# Patient Record
Sex: Female | Born: 1960 | Race: Black or African American | Hispanic: No | Marital: Single | Smoking: Current every day smoker
Health system: Southern US, Community
[De-identification: ages and names within clinical notes are randomized; demographics above are authoritative.]

## PROBLEM LIST (undated history)

## (undated) DIAGNOSIS — R0600 Dyspnea, unspecified: Secondary | ICD-10-CM

## (undated) DIAGNOSIS — M1990 Unspecified osteoarthritis, unspecified site: Secondary | ICD-10-CM

## (undated) DIAGNOSIS — J45909 Unspecified asthma, uncomplicated: Secondary | ICD-10-CM

## (undated) DIAGNOSIS — F419 Anxiety disorder, unspecified: Secondary | ICD-10-CM

## (undated) DIAGNOSIS — E7800 Pure hypercholesterolemia, unspecified: Secondary | ICD-10-CM

## (undated) DIAGNOSIS — F32A Depression, unspecified: Secondary | ICD-10-CM

## (undated) DIAGNOSIS — J449 Chronic obstructive pulmonary disease, unspecified: Secondary | ICD-10-CM

## (undated) DIAGNOSIS — K219 Gastro-esophageal reflux disease without esophagitis: Secondary | ICD-10-CM

## (undated) DIAGNOSIS — I10 Essential (primary) hypertension: Secondary | ICD-10-CM

## (undated) DIAGNOSIS — I639 Cerebral infarction, unspecified: Secondary | ICD-10-CM

## (undated) DIAGNOSIS — F329 Major depressive disorder, single episode, unspecified: Secondary | ICD-10-CM

## (undated) HISTORY — PX: CHOLECYSTECTOMY: SHX55

---

## 2010-10-19 DIAGNOSIS — I639 Cerebral infarction, unspecified: Secondary | ICD-10-CM

## 2010-10-19 HISTORY — DX: Cerebral infarction, unspecified: I63.9

## 2016-07-25 ENCOUNTER — Encounter (HOSPITAL_COMMUNITY): Payer: Self-pay

## 2016-07-25 ENCOUNTER — Inpatient Hospital Stay (HOSPITAL_COMMUNITY)
Admission: EM | Admit: 2016-07-25 | Discharge: 2016-07-28 | DRG: 439 | Disposition: A | Discharge status: Home / Self Care | Payer: Medicaid Other | Attending: Internal Medicine | Admitting: Internal Medicine

## 2016-07-25 DIAGNOSIS — R7989 Other specified abnormal findings of blood chemistry: Secondary | ICD-10-CM

## 2016-07-25 DIAGNOSIS — K852 Alcohol induced acute pancreatitis without necrosis or infection: Principal | ICD-10-CM | POA: Diagnosis present

## 2016-07-25 DIAGNOSIS — Z9049 Acquired absence of other specified parts of digestive tract: Secondary | ICD-10-CM

## 2016-07-25 DIAGNOSIS — N390 Urinary tract infection, site not specified: Secondary | ICD-10-CM | POA: Diagnosis present

## 2016-07-25 DIAGNOSIS — K8070 Calculus of gallbladder and bile duct without cholecystitis without obstruction: Secondary | ICD-10-CM | POA: Diagnosis present

## 2016-07-25 DIAGNOSIS — I10 Essential (primary) hypertension: Secondary | ICD-10-CM | POA: Diagnosis present

## 2016-07-25 DIAGNOSIS — F17210 Nicotine dependence, cigarettes, uncomplicated: Secondary | ICD-10-CM | POA: Diagnosis present

## 2016-07-25 DIAGNOSIS — K859 Acute pancreatitis without necrosis or infection, unspecified: Secondary | ICD-10-CM | POA: Diagnosis present

## 2016-07-25 DIAGNOSIS — Z23 Encounter for immunization: Secondary | ICD-10-CM

## 2016-07-25 DIAGNOSIS — R945 Abnormal results of liver function studies: Secondary | ICD-10-CM

## 2016-07-25 DIAGNOSIS — E860 Dehydration: Secondary | ICD-10-CM | POA: Diagnosis present

## 2016-07-25 DIAGNOSIS — R748 Abnormal levels of other serum enzymes: Secondary | ICD-10-CM

## 2016-07-25 DIAGNOSIS — J449 Chronic obstructive pulmonary disease, unspecified: Secondary | ICD-10-CM | POA: Diagnosis present

## 2016-07-25 DIAGNOSIS — E7800 Pure hypercholesterolemia, unspecified: Secondary | ICD-10-CM | POA: Diagnosis present

## 2016-07-25 DIAGNOSIS — F329 Major depressive disorder, single episode, unspecified: Secondary | ICD-10-CM | POA: Diagnosis present

## 2016-07-25 DIAGNOSIS — E871 Hypo-osmolality and hyponatremia: Secondary | ICD-10-CM

## 2016-07-25 DIAGNOSIS — K8050 Calculus of bile duct without cholangitis or cholecystitis without obstruction: Secondary | ICD-10-CM

## 2016-07-25 DIAGNOSIS — K219 Gastro-esophageal reflux disease without esophagitis: Secondary | ICD-10-CM | POA: Diagnosis present

## 2016-07-25 DIAGNOSIS — M1990 Unspecified osteoarthritis, unspecified site: Secondary | ICD-10-CM | POA: Diagnosis present

## 2016-07-25 DIAGNOSIS — Z79899 Other long term (current) drug therapy: Secondary | ICD-10-CM

## 2016-07-25 DIAGNOSIS — F1010 Alcohol abuse, uncomplicated: Secondary | ICD-10-CM | POA: Diagnosis present

## 2016-07-25 DIAGNOSIS — Z8673 Personal history of transient ischemic attack (TIA), and cerebral infarction without residual deficits: Secondary | ICD-10-CM

## 2016-07-25 DIAGNOSIS — F419 Anxiety disorder, unspecified: Secondary | ICD-10-CM | POA: Diagnosis present

## 2016-07-25 HISTORY — DX: Pure hypercholesterolemia, unspecified: E78.00

## 2016-07-25 HISTORY — DX: Gastro-esophageal reflux disease without esophagitis: K21.9

## 2016-07-25 HISTORY — DX: Depression, unspecified: F32.A

## 2016-07-25 HISTORY — DX: Cerebral infarction, unspecified: I63.9

## 2016-07-25 HISTORY — DX: Dyspnea, unspecified: R06.00

## 2016-07-25 HISTORY — DX: Major depressive disorder, single episode, unspecified: F32.9

## 2016-07-25 HISTORY — DX: Unspecified asthma, uncomplicated: J45.909

## 2016-07-25 HISTORY — DX: Essential (primary) hypertension: I10

## 2016-07-25 HISTORY — DX: Anxiety disorder, unspecified: F41.9

## 2016-07-25 HISTORY — DX: Unspecified osteoarthritis, unspecified site: M19.90

## 2016-07-25 HISTORY — DX: Chronic obstructive pulmonary disease, unspecified: J44.9

## 2016-07-25 LAB — COMPREHENSIVE METABOLIC PANEL
ALBUMIN: 3.7 g/dL (ref 3.5–5.0)
ALK PHOS: 69 U/L (ref 38–126)
ALT: 27 U/L (ref 14–54)
AST: 35 U/L (ref 15–41)
Anion gap: 9 (ref 5–15)
BUN: 5 mg/dL — ABNORMAL LOW (ref 6–20)
CALCIUM: 9 mg/dL (ref 8.9–10.3)
CHLORIDE: 94 mmol/L — AB (ref 101–111)
CO2: 20 mmol/L — AB (ref 22–32)
CREATININE: 0.62 mg/dL (ref 0.44–1.00)
GFR calc non Af Amer: >60 mL/min (ref >60)
GLUCOSE: 97 mg/dL (ref 65–99)
Potassium: 3.9 mmol/L (ref 3.5–5.1)
SODIUM: 123 mmol/L — AB (ref 135–145)
Total Bilirubin: 0.6 mg/dL (ref 0.3–1.2)
Total Protein: 7.3 g/dL (ref 6.5–8.1)

## 2016-07-25 LAB — CBC
HCT: 31.6 % — ABNORMAL LOW (ref 36.0–46.0)
Hemoglobin: 11.2 g/dL — ABNORMAL LOW (ref 12.0–15.0)
MCH: 31.3 pg (ref 26.0–34.0)
MCHC: 35.4 g/dL (ref 30.0–36.0)
MCV: 88.3 fL (ref 78.0–100.0)
PLATELETS: 138 10*3/uL — AB (ref 150–400)
RBC: 3.58 MIL/uL — AB (ref 3.87–5.11)
RDW: 15.4 % (ref 11.5–15.5)
WBC: 4.9 10*3/uL (ref 4.0–10.5)

## 2016-07-25 LAB — URINE MICROSCOPIC-ADD ON

## 2016-07-25 LAB — URINALYSIS, ROUTINE W REFLEX MICROSCOPIC
GLUCOSE, UA: NEGATIVE mg/dL
HGB URINE DIPSTICK: NEGATIVE
Ketones, ur: NEGATIVE mg/dL
Nitrite: POSITIVE — AB
PROTEIN: NEGATIVE mg/dL
SPECIFIC GRAVITY, URINE: 1.027 (ref 1.005–1.030)
pH: 5.5 (ref 5.0–8.0)

## 2016-07-25 LAB — LIPASE, BLOOD: LIPASE: 336 U/L — AB (ref 11–51)

## 2016-07-25 MED ORDER — INFLUENZA VAC SPLIT QUAD 0.5 ML IM SUSY
0.5000 mL | PREFILLED_SYRINGE | INTRAMUSCULAR | Status: AC
  Administered 2016-07-28: 0.5 mL via INTRAMUSCULAR
  Filled 2016-07-25 (×2): qty 0.5

## 2016-07-25 MED ORDER — PNEUMOCOCCAL VAC POLYVALENT 25 MCG/0.5ML IJ INJ
0.5000 mL | INJECTION | INTRAMUSCULAR | Status: DC
  Filled 2016-07-25 (×2): qty 0.5

## 2016-07-25 MED ORDER — LORAZEPAM 2 MG/ML IJ SOLN
1.0000 mg | Freq: Four times a day (QID) | INTRAMUSCULAR | Status: DC | PRN
  Administered 2016-07-25 – 2016-07-27 (×2): 1 mg via INTRAVENOUS
  Filled 2016-07-25 (×2): qty 1

## 2016-07-25 MED ORDER — ALBUTEROL SULFATE (2.5 MG/3ML) 0.083% IN NEBU
2.5000 mg | INHALATION_SOLUTION | Freq: Four times a day (QID) | RESPIRATORY_TRACT | Status: DC | PRN
  Administered 2016-07-25: 2.5 mg via RESPIRATORY_TRACT
  Filled 2016-07-25: qty 3

## 2016-07-25 MED ORDER — ONDANSETRON HCL 4 MG PO TABS: 4.0000 mg | ORAL_TABLET | Freq: Four times a day (QID) | ORAL | Status: DC | PRN

## 2016-07-25 MED ORDER — SODIUM CHLORIDE 0.9 % IV SOLN
INTRAVENOUS | Status: DC
  Administered 2016-07-25 – 2016-07-27 (×5): via INTRAVENOUS

## 2016-07-25 MED ORDER — HYDROMORPHONE HCL 1 MG/ML IJ SOLN
1.0000 mg | INTRAMUSCULAR | Status: DC | PRN
  Administered 2016-07-25 – 2016-07-26 (×5): 1 mg via INTRAVENOUS
  Filled 2016-07-25 (×5): qty 1

## 2016-07-25 MED ORDER — ENOXAPARIN SODIUM 40 MG/0.4ML ~~LOC~~ SOLN
40.0000 mg | SUBCUTANEOUS | Status: DC
  Administered 2016-07-25 – 2016-07-27 (×3): 40 mg via SUBCUTANEOUS
  Filled 2016-07-25 (×3): qty 0.4

## 2016-07-25 MED ORDER — DIPHENHYDRAMINE HCL 50 MG/ML IJ SOLN
6.2500 mg | Freq: Four times a day (QID) | INTRAMUSCULAR | Status: DC | PRN
  Administered 2016-07-25 – 2016-07-26 (×2): 6.5 mg via INTRAVENOUS
  Filled 2016-07-25 (×2): qty 1

## 2016-07-25 MED ORDER — MORPHINE SULFATE (PF) 4 MG/ML IV SOLN
4.0000 mg | Freq: Once | INTRAVENOUS | Status: AC
  Administered 2016-07-25: 4 mg via INTRAVENOUS
  Filled 2016-07-25: qty 1

## 2016-07-25 MED ORDER — SODIUM CHLORIDE 0.9 % IV BOLUS (SEPSIS)
1000.0000 mL | Freq: Once | INTRAVENOUS | Status: AC
  Administered 2016-07-25: 1000 mL via INTRAVENOUS

## 2016-07-25 MED ORDER — ONDANSETRON HCL 4 MG/2ML IJ SOLN: 4.0000 mg | Freq: Four times a day (QID) | INTRAMUSCULAR | Status: DC | PRN

## 2016-07-25 MED ORDER — FOLIC ACID 1 MG PO TABS
1.0000 mg | ORAL_TABLET | Freq: Every day | ORAL | Status: DC
Start: 2016-07-25 — End: 2016-07-28
  Administered 2016-07-25 – 2016-07-28 (×4): 1 mg via ORAL
  Filled 2016-07-25 (×4): qty 1

## 2016-07-25 MED ORDER — DEXTROSE 5 % IV SOLN
1.0000 g | INTRAVENOUS | Status: DC
  Administered 2016-07-25 – 2016-07-26 (×2): 1 g via INTRAVENOUS
  Filled 2016-07-25 (×3): qty 10

## 2016-07-25 MED ORDER — ORAL CARE MOUTH RINSE
15.0000 mL | Freq: Two times a day (BID) | OROMUCOSAL | Status: DC
  Administered 2016-07-25 – 2016-07-28 (×6): 15 mL via OROMUCOSAL

## 2016-07-25 MED ORDER — ACETAMINOPHEN 650 MG RE SUPP: 650.0000 mg | Freq: Four times a day (QID) | RECTAL | Status: DC | PRN

## 2016-07-25 MED ORDER — ONDANSETRON HCL 4 MG/2ML IJ SOLN
4.0000 mg | Freq: Once | INTRAMUSCULAR | Status: AC
  Administered 2016-07-25: 4 mg via INTRAVENOUS
  Filled 2016-07-25: qty 2

## 2016-07-25 MED ORDER — ADULT MULTIVITAMIN W/MINERALS CH
1.0000 | ORAL_TABLET | Freq: Every day | ORAL | Status: DC
  Administered 2016-07-25 – 2016-07-28 (×4): 1 via ORAL
  Filled 2016-07-25 (×4): qty 1

## 2016-07-25 MED ORDER — VITAMIN B-1 100 MG PO TABS
100.0000 mg | ORAL_TABLET | Freq: Every day | ORAL | Status: DC
  Administered 2016-07-25 – 2016-07-28 (×4): 100 mg via ORAL
  Filled 2016-07-25 (×4): qty 1

## 2016-07-25 MED ORDER — MORPHINE SULFATE (PF) 2 MG/ML IV SOLN
2.0000 mg | INTRAVENOUS | Status: DC | PRN
  Administered 2016-07-25: 2 mg via INTRAVENOUS
  Filled 2016-07-25: qty 1

## 2016-07-25 MED ORDER — LORAZEPAM 1 MG PO TABS: 1.0000 mg | ORAL_TABLET | Freq: Four times a day (QID) | ORAL | Status: DC | PRN

## 2016-07-25 MED ORDER — THIAMINE HCL 100 MG/ML IJ SOLN: 100.0000 mg | Freq: Every day | INTRAMUSCULAR | Status: DC

## 2016-07-25 MED ORDER — ACETAMINOPHEN 325 MG PO TABS: 650.0000 mg | ORAL_TABLET | Freq: Four times a day (QID) | ORAL | Status: DC | PRN

## 2016-07-25 NOTE — ED Provider Notes (Signed)
[facility]-EMERGENCY DEPT Provider Note   CSN: [id number] Arrival date & time: 07/25/16  1251     History   Chief Complaint Chief Complaint  Patient presents with  . Abdominal Pain    HPI Lisa Allen is a 55 y.o. female.  Patient presents today with a chief complaint of upper abdominal pain.  Pain radiates to the back.  Pain has been constant since earlier this afternoon.  She also reports associated nausea and several episodes of bilious vomiting just prior to arrival.  She states that she has been told in the past that she has Gallstones.  She reports history of Cholecystectomy one year ago in [place].  She reports drinking 40 ounces of beer everyday.  She has taken Aleve for the pain without improvement.  She denies fever, chills, chest pain, SOB, or any other symptoms.        Past Medical History:  Diagnosis Date  . Anxiety   . Gastroesophageal reflux disease   . Hypercholesteremia   . Hypertension     There are no active problems to display for this patient.   Past Surgical History:  Procedure Laterality Date  . CHOLECYSTECTOMY      OB History    No data available       Home Medications    Prior to Admission medications   Not on File    Family History No family history on file.  Social History Social History  Substance Use Topics  . Smoking status: Current Every Day Smoker  . Smokeless tobacco: Never Used  . Alcohol use Yes     Comment: "beer"     Allergies   Review of patient's allergies indicates no known allergies.   Review of Systems Review of Systems  All other systems reviewed and are negative.    Physical Exam Updated Vital Signs BP 133/90   Pulse 93   Temp 98.7 F (37.1 C) (Oral)   Resp 21   Ht 4\' 11"  (1.499 m)   Wt 72.6 kg   SpO2 100%   BMI 32.32 kg/m   Physical Exam  Constitutional: She appears well-developed and well-nourished.  HENT:  Head: Normocephalic and atraumatic.  Neck: Normal range of motion. Neck supple.   Cardiovascular: Normal rate, regular rhythm and normal heart sounds.   Pulmonary/Chest: Effort normal and breath sounds normal.  Abdominal: Soft. Bowel sounds are normal. She exhibits no distension and no mass. There is tenderness in the right upper quadrant, epigastric area and left upper quadrant. There is no rebound and no guarding. No hernia.  Neurological: She is alert.  Skin: Skin is warm and dry.  Psychiatric: She has a normal mood and affect.  Nursing note and vitals reviewed.    ED Treatments / Results  Labs (all labs ordered are listed, but only abnormal results are displayed) Labs Reviewed  LIPASE, BLOOD - Abnormal; Notable for the following:       Result Value   Lipase 336 (*)    All other components within normal limits  COMPREHENSIVE METABOLIC PANEL - Abnormal; Notable for the following:    Sodium 123 (*)    Chloride 94 (*)    CO2 20 (*)    BUN 5 (*)    All other components within normal limits  CBC - Abnormal; Notable for the following:    RBC 3.58 (*)    Hemoglobin 11.2 (*)    HCT 31.6 (*)    Platelets 138 (*)    All other components  within normal limits  URINALYSIS, ROUTINE W REFLEX MICROSCOPIC (NOT AT [facility]) - Abnormal; Notable for the following:    Color, Urine ORANGE (*)    APPearance CLOUDY (*)    Bilirubin Urine MODERATE (*)    Nitrite POSITIVE (*)    Leukocytes, UA SMALL (*)    All other components within normal limits  URINE MICROSCOPIC-ADD ON - Abnormal; Notable for the following:    Squamous Epithelial / LPF TOO NUMEROUS TO COUNT (*)    Bacteria, UA FEW (*)    All other components within normal limits    EKG  EKG Interpretation None       Radiology No results found.  Procedures Procedures (including critical care time)  Medications Ordered in ED Medications  sodium chloride 0.9 % bolus 1,000 mL (not administered)  morphine 4 MG/ML injection 4 mg (not administered)  ondansetron (ZOFRAN) injection 4 mg (not administered)      Initial Impression / Assessment and Plan / ED Course  I have reviewed the triage vital signs and the nursing notes.  Pertinent labs & imaging results that were available during my care of the patient were reviewed by me and considered in my medical decision making (see chart for details).  Clinical Course   Patient presents today with upper abdominal pain, nausea, and vomiting.  Onset of symptoms earlier today.  She reports a history of Gallstones and reports Cholecystectomy one year ago.  She also reports drinking alcohol daily.  Lipase is elevated at 336.  Patient given IVF, pain meds, and Zofran.  Patient admitted to [facility] for further management.  Final Clinical Impressions(s) / ED Diagnoses   Final diagnoses:  None    New Prescriptions New Prescriptions   No medications on file     Santiago Glad, PA-C 07/25/16 2321    Courteney Lyn Mackuen, MD 07/27/16 [phone]

## 2016-07-25 NOTE — ED Triage Notes (Signed)
She tells me she has hx of cholecystectomy and was slated for "removal of stones from my pancrease" this month.

## 2016-07-25 NOTE — ED Triage Notes (Signed)
She states she has known gallstones, and she describes having a common duct stone. She c/o upper abd. Discomfort plus nausea today. She was to have [place] removed in [place] earlier this month "but couldn't get a ride".

## 2016-07-25 NOTE — ED Notes (Signed)
Pt states that she moved here from [place] today. She says that she was scheduled to have surgery in [place] on the 4th but missed it because she couldn't get a ride. [place] 'because I had to get up and get on down the road in a hurry.' She says no workup was done in [place] and is unsure of her doctor's name.

## 2016-07-25 NOTE — H&P (Signed)
[facility] H&P    Patient Demographics:    Lisa Allen, is a 55 y.o. female  MRN: [id number]  DOB - [birth year]  Admit Date - 07/25/2016  Referring MD/NP/PA: Santiago GladHeather Laisure  Outpatient Primary MD for the patient is No PCP Per Patient  Patient coming from: Home  Chief Complaint  Patient presents with  . Abdominal Pain      HPI:    Lisa Allen  is a 55 y.o. female, With history of gallstones status post cholecystectomy who came to the hospital with 1 day history of upper abdominal pain. Pain has been constant associated with nausea and several episodes of bilious vomiting. She denies diarrhea. No chest pain or shortness of breath. She does drink 40 ounces of beer everyday. Patient took Aleve at home for pain which did not improve it. She denies fever or chills. No dysuria urgency or frequency of urination.  In the ED lab work showed lipase 336, sodium 123.    Review of systems:    In addition to the HPI above,  No Fever-chills, No Headache, No changes with Vision or hearing, No problems swallowing food or Liquids, No Chest pain, Cough + Shortness of Breath,  No Blood in stool or Urine, No dysuria, No new skin rashes or bruises, No new joints pains-aches,  No new weakness, tingling, numbness in any extremity, No recent weight gain or loss, No polyuria, polydypsia or polyphagia, No significant Mental Stressors.  A full 10 point Review of Systems was done, except as stated above, all other Review of Systems were negative.   With Past History of the following :    Past Medical History:  Diagnosis Date  . Anxiety   . Gastroesophageal reflux disease   . Hypercholesteremia   . Hypertension       Past Surgical History:  Procedure Laterality Date  . CHOLECYSTECTOMY        Social History:      Social History  Substance Use Topics  . Smoking status: Current Every Day Smoker  . Smokeless  tobacco: Never Used  . Alcohol use Yes     Comment: "beer"       Family History :   Patient's aunt had lung cancer   Home Medications:   Prior to Admission medications   Not on File     Allergies:    No Known Allergies   Physical Exam:   Vitals  Blood pressure 133/90, pulse 93, temperature 98.7 F (37.1 C), temperature source Oral, resp. rate 21, height 4\' 11"  (1.499 m), weight 72.6 kg (160 lb), SpO2 100 %.  1.  General: African-American female in no acute distress  2. Psychiatric:  Intact judgement and  insight, awake alert, oriented x 3.  3. Neurologic: No focal neurological deficits, all cranial nerves intact.Strength 5/5 all 4 extremities, sensation intact all 4 extremities, plantars down going.  4. Eyes :  anicteric sclerae, moist conjunctivae with no lid lag. PERRLA.  5. ENMT:  Oropharynx clear with moist mucous membranes and good dentition  6. Neck:  supple,  no cervical lymphadenopathy appriciated, No thyromegaly  7. Respiratory : Normal respiratory effort, good air movement bilaterally,clear to  auscultation bilaterally  8. Cardiovascular : RRR, no gallops, rubs or murmurs, no leg edema  9. Gastrointestinal:  Positive bowel sounds, abdomen soft, positive epigastric tenderness to palpation, no hepatosplenomegaly, no rigidity or guarding       10. Skin:  No cyanosis, normal texture and turgor, no rash, lesions or ulcers  11.Musculoskeletal:  Good muscle tone,  joints appear normal , no effusions,  normal range of motion    Data Review:    CBC  Recent Labs Lab 07/25/16 1311  WBC 4.9  HGB 11.2*  HCT 31.6*  PLT 138*  MCV 88.3  MCH 31.3  MCHC 35.4  RDW 15.4   ------------------------------------------------------------------------------------------------------------------  Chemistries   Recent Labs Lab 07/25/16 1311  NA 123*  K 3.9  CL 94*  CO2 20*  GLUCOSE 97  BUN 5*  CREATININE 0.62  CALCIUM 9.0  AST 35  ALT 27  ALKPHOS  69  BILITOT 0.6   ------------------------------------------------------------------------------------------------------------------  ------------------------------------------------------------------------------------------------------------------ GFR: Estimated Creatinine Clearance: 70.6 mL/min (by C-G formula based on SCr of 0.62 mg/dL). Liver Function Tests:  Recent Labs Lab 07/25/16 1311  AST 35  ALT 27  ALKPHOS 69  BILITOT 0.6  PROT 7.3  ALBUMIN 3.7    Recent Labs Lab 07/25/16 1311  LIPASE 336*     --------------------------------------------------------------------------------------------------------------- Urine analysis:    Component Value Date/Time   COLORURINE ORANGE (A) 07/25/2016 1301   APPEARANCEUR CLOUDY (A) 07/25/2016 1301   LABSPEC 1.027 07/25/2016 1301   PHURINE 5.5 07/25/2016 1301   GLUCOSEU NEGATIVE 07/25/2016 1301   HGBUR NEGATIVE 07/25/2016 1301   BILIRUBINUR MODERATE (A) 07/25/2016 1301   KETONESUR NEGATIVE 07/25/2016 1301   PROTEINUR NEGATIVE 07/25/2016 1301   NITRITE POSITIVE (A) 07/25/2016 1301   LEUKOCYTESUR SMALL (A) 07/25/2016 1301      Imaging Results:    No results found.  My personal review of EKG: Rhythm NSR   Assessment & Plan:    Active Problems:   Pancreatitis   Acute pancreatitis   Hyponatremia   1. Acute pancreatitis- likely alcohol induced, will keep nothing by mouth, IV fluids. Recheck lipase in a.m. If normal consider starting clear liquid diet. 2. Hyponatremia- likely from repeated vomiting, check serum osmolarity. Start IV normal saline, follow BMP in a.m. 3. ? UTI- patient's UA is abnormal shows positive nitrite, she denies any symptoms of dysuria. We'll start empiric ceftriaxone and updated urine culture 4. Alcohol abuse-patient drinks 40 ounces beer everyday, will start on CIWA protocol. No symptoms of alcohol withdrawal at this time    DVT Prophylaxis-   Lovenox   AM Labs Ordered, also please review  Full Orders  Family Communication: No family at bedside  Code Status:  Full code  Admission status: Observation    Time spent in minutes : 60 minutes   Retal Tonkinson S M.D on 07/25/2016 at 4:17 PM  Between 7am to 7pm - Pager - [phone]. After 7pm go to [email or web address] - password [facility]  [facility] - Office  [phone]

## 2016-07-26 ENCOUNTER — Observation Stay (HOSPITAL_COMMUNITY): Payer: Medicaid Other

## 2016-07-26 DIAGNOSIS — F329 Major depressive disorder, single episode, unspecified: Secondary | ICD-10-CM | POA: Diagnosis present

## 2016-07-26 DIAGNOSIS — F1020 Alcohol dependence, uncomplicated: Secondary | ICD-10-CM | POA: Diagnosis not present

## 2016-07-26 DIAGNOSIS — F419 Anxiety disorder, unspecified: Secondary | ICD-10-CM | POA: Diagnosis present

## 2016-07-26 DIAGNOSIS — K8070 Calculus of gallbladder and bile duct without cholecystitis without obstruction: Secondary | ICD-10-CM | POA: Diagnosis present

## 2016-07-26 DIAGNOSIS — E860 Dehydration: Secondary | ICD-10-CM | POA: Diagnosis present

## 2016-07-26 DIAGNOSIS — K219 Gastro-esophageal reflux disease without esophagitis: Secondary | ICD-10-CM | POA: Diagnosis present

## 2016-07-26 DIAGNOSIS — K8050 Calculus of bile duct without cholangitis or cholecystitis without obstruction: Secondary | ICD-10-CM | POA: Diagnosis not present

## 2016-07-26 DIAGNOSIS — K859 Acute pancreatitis without necrosis or infection, unspecified: Secondary | ICD-10-CM | POA: Diagnosis present

## 2016-07-26 DIAGNOSIS — F17210 Nicotine dependence, cigarettes, uncomplicated: Secondary | ICD-10-CM | POA: Diagnosis present

## 2016-07-26 DIAGNOSIS — I10 Essential (primary) hypertension: Secondary | ICD-10-CM | POA: Diagnosis present

## 2016-07-26 DIAGNOSIS — K8500 Idiopathic acute pancreatitis without necrosis or infection: Secondary | ICD-10-CM | POA: Diagnosis not present

## 2016-07-26 DIAGNOSIS — Z9049 Acquired absence of other specified parts of digestive tract: Secondary | ICD-10-CM | POA: Diagnosis not present

## 2016-07-26 DIAGNOSIS — K852 Alcohol induced acute pancreatitis without necrosis or infection: Secondary | ICD-10-CM | POA: Diagnosis not present

## 2016-07-26 DIAGNOSIS — E7800 Pure hypercholesterolemia, unspecified: Secondary | ICD-10-CM | POA: Diagnosis present

## 2016-07-26 DIAGNOSIS — F1010 Alcohol abuse, uncomplicated: Secondary | ICD-10-CM | POA: Diagnosis present

## 2016-07-26 DIAGNOSIS — N390 Urinary tract infection, site not specified: Secondary | ICD-10-CM | POA: Diagnosis present

## 2016-07-26 DIAGNOSIS — Z8673 Personal history of transient ischemic attack (TIA), and cerebral infarction without residual deficits: Secondary | ICD-10-CM | POA: Diagnosis not present

## 2016-07-26 DIAGNOSIS — E871 Hypo-osmolality and hyponatremia: Secondary | ICD-10-CM | POA: Diagnosis present

## 2016-07-26 DIAGNOSIS — Z79899 Other long term (current) drug therapy: Secondary | ICD-10-CM | POA: Diagnosis not present

## 2016-07-26 DIAGNOSIS — J449 Chronic obstructive pulmonary disease, unspecified: Secondary | ICD-10-CM | POA: Diagnosis present

## 2016-07-26 DIAGNOSIS — Z23 Encounter for immunization: Secondary | ICD-10-CM | POA: Diagnosis not present

## 2016-07-26 DIAGNOSIS — M1990 Unspecified osteoarthritis, unspecified site: Secondary | ICD-10-CM | POA: Diagnosis present

## 2016-07-26 LAB — RAPID URINE DRUG SCREEN, HOSP PERFORMED
AMPHETAMINES: NOT DETECTED
BENZODIAZEPINES: POSITIVE — AB
Barbiturates: NOT DETECTED
Cocaine: POSITIVE — AB
Opiates: POSITIVE — AB
TETRAHYDROCANNABINOL: NOT DETECTED

## 2016-07-26 LAB — COMPREHENSIVE METABOLIC PANEL
ALBUMIN: 3.3 g/dL — AB (ref 3.5–5.0)
ALK PHOS: 72 U/L (ref 38–126)
ALT: 32 U/L (ref 14–54)
AST: 73 U/L — AB (ref 15–41)
Anion gap: 7 (ref 5–15)
BUN: 5 mg/dL — AB (ref 6–20)
CO2: 22 mmol/L (ref 22–32)
CREATININE: 0.61 mg/dL (ref 0.44–1.00)
Calcium: 8.4 mg/dL — ABNORMAL LOW (ref 8.9–10.3)
Chloride: 104 mmol/L (ref 101–111)
GFR calc Af Amer: >60 mL/min (ref >60)
GLUCOSE: 92 mg/dL (ref 65–99)
POTASSIUM: 3.9 mmol/L (ref 3.5–5.1)
Sodium: 133 mmol/L — ABNORMAL LOW (ref 135–145)
TOTAL PROTEIN: 6.4 g/dL — AB (ref 6.5–8.1)
Total Bilirubin: 0.4 mg/dL (ref 0.3–1.2)

## 2016-07-26 LAB — CBC
HEMATOCRIT: 29.5 % — AB (ref 36.0–46.0)
HEMOGLOBIN: 10 g/dL — AB (ref 12.0–15.0)
MCH: 31.2 pg (ref 26.0–34.0)
MCHC: 33.9 g/dL (ref 30.0–36.0)
MCV: 91.9 fL (ref 78.0–100.0)
Platelets: 153 10*3/uL (ref 150–400)
RBC: 3.21 MIL/uL — AB (ref 3.87–5.11)
RDW: 15.4 % (ref 11.5–15.5)
WBC: 3.5 10*3/uL — AB (ref 4.0–10.5)

## 2016-07-26 LAB — OSMOLALITY, URINE: Osmolality, Ur: 91 mOsm/kg — ABNORMAL LOW (ref 300–900)

## 2016-07-26 LAB — OSMOLALITY: Osmolality: 261 mOsm/kg — ABNORMAL LOW (ref 275–295)

## 2016-07-26 LAB — LIPASE, BLOOD: LIPASE: 157 U/L — AB (ref 11–51)

## 2016-07-26 MED ORDER — DIPHENHYDRAMINE HCL 50 MG/ML IJ SOLN
25.0000 mg | Freq: Once | INTRAMUSCULAR | Status: AC
  Administered 2016-07-26: 25 mg via INTRAVENOUS
  Filled 2016-07-26: qty 1

## 2016-07-26 MED ORDER — DIPHENHYDRAMINE HCL 50 MG/ML IJ SOLN
12.5000 mg | Freq: Four times a day (QID) | INTRAMUSCULAR | Status: DC | PRN
  Administered 2016-07-26 – 2016-07-28 (×8): 12.5 mg via INTRAVENOUS
  Filled 2016-07-26 (×8): qty 1

## 2016-07-26 MED ORDER — HYDROMORPHONE HCL 1 MG/ML IJ SOLN
0.5000 mg | INTRAMUSCULAR | Status: DC | PRN
  Administered 2016-07-26: 0.5 mg via INTRAVENOUS
  Filled 2016-07-26: qty 1

## 2016-07-26 MED ORDER — HYDROMORPHONE HCL 1 MG/ML IJ SOLN
0.5000 mg | INTRAMUSCULAR | Status: DC | PRN
  Administered 2016-07-26 – 2016-07-28 (×15): 0.5 mg via INTRAVENOUS
  Filled 2016-07-26 (×15): qty 1

## 2016-07-26 NOTE — Progress Notes (Signed)
PROGRESS NOTE  Lisa Allen ZOX:[id number] DOB: 1961-07-24 DOA: 07/25/2016 PCP: No PCP Per Patient  HPI/Recap of past 24 hours:  Report constant pain, she want to eat Per RN, patient went to the cafeteria to get chips even though she is told not to eat.  Assessment/Plan: Active Problems:   Pancreatitis   Acute pancreatitis   Hyponatremia  1. Acute pancreatitis- ab US showed retained CBG Stone, she also drink alcohol daily, she reported she has not been eating for the last 4 days due to pain and n/v, no fever, npo, IV fluids. [facility] consutled. 2. CBD stone: s/p cholecystectomy a year ago, she was scheduled to have retained stone removed on 10/4, however, per patient she could not make to the appointment. Mild elevated lft, no fever, no leukocytosis, GI consulted here. 3. Elevated lft, ast>alt, likely cbd stone and  alcohol related, hepatitis panel pending 4. Hyponatremia- likely from repeated vomiting, check serum osmolarity. Start IV normal saline, follow BMP in a.m. 5. ? UTI- patient's UA is abnormal shows positive nitrite, she denies any symptoms of dysuria. We'll start empiric ceftriaxone and updated urine culture 6. Alcohol abuse-patient drinks 40 ounces beer everyday, will start on CIWA protocol. No symptoms of alcohol withdrawal at this time  7. Itching, request benadryl,    DVT Prophylaxis-   Lovenox , will need to hold lovenox if GI procedure planned  Family Communication: No family at bedside  Code Status:  Full code  Disposition Plan: pending   Consultants:  GI  Procedures:  none  Antibiotics:  rocephin   Objective: BP 115/75 (BP Location: Left Arm)   Pulse 87   Temp 97.6 F (36.4 C) (Oral)   Resp 18   Ht 4\' 11"  (1.499 m)   Wt 72.6 kg (160 lb)   SpO2 98%   BMI 32.32 kg/m   Intake/Output Summary (Last 24 hours) at 07/26/16 0920 Last data filed at 07/26/16 0511  Gross per 24 hour  Intake           926.67 ml  Output              400 ml    Net           526.67 ml   Filed Weights   07/25/16 1303  Weight: 72.6 kg (160 lb)    Exam:   General:  In pain, no active n/v  Cardiovascular: RRR  Respiratory: CTABL  Abdomen: tender , no guarding, no rebound, NT, positive BS  Musculoskeletal: No Edema  Neuro: aaox3  Data Reviewed: Basic Metabolic Panel:  Recent Labs Lab 07/25/16 1311 07/26/16 0549  NA 123* 133*  K 3.9 3.9  CL 94* 104  CO2 20* 22  GLUCOSE 97 92  BUN 5* 5*  CREATININE 0.62 0.61  CALCIUM 9.0 8.4*   Liver Function Tests:  Recent Labs Lab 07/25/16 1311 07/26/16 0549  AST 35 73*  ALT 27 32  ALKPHOS 69 72  BILITOT 0.6 0.4  PROT 7.3 6.4*  ALBUMIN 3.7 3.3*    Recent Labs Lab 07/25/16 1311 07/26/16 0549  LIPASE 336* 157*   No results for input(s): AMMONIA in the last 168 hours. CBC:  Recent Labs Lab 07/25/16 1311 07/26/16 0549  WBC 4.9 3.5*  HGB 11.2* 10.0*  HCT 31.6* 29.5*  MCV 88.3 91.9  PLT 138* 153   Cardiac Enzymes:   No results for input(s): CKTOTAL, CKMB, CKMBINDEX, TROPONINI in the last 168 hours. BNP (last 3 results) No results for  input(s): BNP in the last 8760 hours.  ProBNP (last 3 results) No results for input(s): PROBNP in the last 8760 hours.  CBG: No results for input(s): GLUCAP in the last 168 hours.  No results found for this or any previous visit (from the past 240 hour(s)).   Studies: No results found.  Scheduled Meds: . cefTRIAXone (ROCEPHIN)  IV  1 g Intravenous Q24H  . enoxaparin (LOVENOX) injection  40 mg Subcutaneous Q24H  . folic acid  1 mg Oral Daily  . Influenza vac split quadrivalent PF  0.5 mL Intramuscular Tomorrow-1000  . mouth rinse  15 mL Mouth Rinse BID  . multivitamin with minerals  1 tablet Oral Daily  . pneumococcal 23 valent vaccine  0.5 mL Intramuscular Tomorrow-1000  . thiamine  100 mg Oral Daily   Or  . thiamine  100 mg Intravenous Daily    Continuous Infusions: . sodium chloride 100 mL/hr at 07/26/16 0240      Time spent: 35mins  Enes Wegener MD, PhD  [facility] Pager [phone]. If 7PM-7AM, please contact night-coverage at [email or web address], password [email or web address] 07/26/2016, 9:20 AM  LOS: 0 days

## 2016-07-26 NOTE — Progress Notes (Signed)
Pt asked if she could have anything to eat, after reading MD's notes, I told her they wanted her blood drawn in the morning before they considered starting her on clear liquids.  Pt upset, medline offered, pt accepted but stated: "I can't take this no more, I'm going to the vending machine."   Pt educated on why it is important to stay NPO at this time.  Pt walked to vending machine and purchased bbq chips.  Stating "I will only eat a few."   Pt returned to bed.  Will continue to monitor.  Lisa Allen, Lisa Allen

## 2016-07-26 NOTE — Progress Notes (Signed)
Paged K. Schorr for diet orders since they will be doing the MRCP in the morning. Patient is threatening to eat and radiology who spoke with MRI tech said it would be alright for patient to have something until 4 hours before MRCP. Dr Clearence PedSchorr approved pt to be on reg diet and NPO at midnight.

## 2016-07-26 NOTE — Consult Note (Signed)
[facility] Consult Note  Referring Provider: No ref. provider found Primary Care Physician:  No PCP Per Patient Primary Gastroenterologist:  Dr.  Laurel Dimmer Complaint: Abdominal pain HPI: Lisa Allen is an 55 y.o. black female  who presents with relatively acute epigastric pain yesterday on top of more chronic milder pain. She was found to have an elevated lipase 300 abnormal liver function tests except for an AST in the 70s. Her lipase is in the 100s today. She had an ultrasound which showed absent gallbladder 15 mm bile duct and echogenic material in the duct. The patient had a cholecystectomy in [facility] about a year ago. She describes what sounds like a referral for ERCP subsequently probably for common bile duct stones but she never made it. She does drink 80 ounces of beer every day.  Past Medical History:  Diagnosis Date  . Anxiety   . Arthritis    left knee  . Asthma   . COPD (chronic obstructive pulmonary disease) ([place])   . Depression   . Dyspnea   . Gastroesophageal reflux disease   . Hypercholesteremia   . Hypertension   . Stroke [facility]) 2012   weakness bilateral legs left >right    Past Surgical History:  Procedure Laterality Date  . CHOLECYSTECTOMY      Medications Prior to Admission  Medication Sig Dispense Refill  . atorvastatin (LIPITOR) 20 MG tablet Take 20 mg by mouth every evening.  5  . busPIRone (BUSPAR) 5 MG tablet Take 5 mg by mouth 3 (three) times daily as needed (anxiety).   0  . cholecalciferol (VITAMIN D) 1000 units tablet Take 1,000 Units by mouth every morning.    . folic acid (FOLVITE) 1 MG tablet Take 1 mg by mouth every morning.   6  . lisinopril (PRINIVIL,ZESTRIL) 20 MG tablet Take 20 mg by mouth daily.  1  . methocarbamol (ROBAXIN) 500 MG tablet Take 500 mg by mouth 3 (three) times daily as needed for muscle spasms.   0  . ondansetron (ZOFRAN) 4 MG tablet Take 4 mg by mouth every 8 (eight) hours as needed for nausea or  vomiting.   0  . pantoprazole (PROTONIX) 40 MG tablet Take 40 mg by mouth every morning.  2  . potassium chloride SA (K-DUR,KLOR-CON) 20 MEQ tablet Take 20 mEq by mouth every morning.  5  . traZODone (DESYREL) 50 MG tablet Take 50 mg by mouth at bedtime.  4  . triamcinolone cream (KENALOG) 0.1 % Apply 1 application topically 3 (three) times daily as needed (rash).   1  . amLODipine (NORVASC) 5 MG tablet Take 5 mg by mouth every morning.  5    Allergies: No Known Allergies  History reviewed. No pertinent family history.  Social History:  reports that she has been smoking.  She has a 18.00 pack-year smoking history. She has never used smokeless tobacco. She reports that she drinks about 1.2 oz of alcohol per week . She reports that she does not use drugs.  Review of Systems: negative except as above   Blood pressure 115/75, pulse 87, temperature 97.6 F (36.4 C), temperature source Oral, resp. rate 18, height '4\' 11"'$  (1.499 m), weight 72.6 kg (160 lb), SpO2 98 %. Head: Normocephalic, without obvious abnormality, atraumatic Neck: no adenopathy, no carotid bruit, no JVD, supple, symmetrical, trachea midline and thyroid not enlarged, symmetric, no tenderness/mass/nodules Resp: clear to auscultation bilaterally Cardio: regular rate and rhythm, S1, S2 normal, no murmur, click, rub or  gallop GI: Abdomen soft moderately tender in the epigastrium. No hepatosplenomegaly mass or guarding. Extremities: extremities normal, atraumatic, no cyanosis or edema  Results for orders placed or performed during the hospital encounter of 07/25/16 (from the past 48 hour(s))  Urinalysis, Routine w reflex microscopic     Status: Abnormal   Collection Time: 07/25/16  1:01 PM  Result Value Ref Range   Color, Urine ORANGE (A) YELLOW    Comment: BIOCHEMICALS MAY BE AFFECTED BY COLOR   APPearance CLOUDY (A) CLEAR   Specific Gravity, Urine 1.027 1.005 - 1.030   pH 5.5 5.0 - 8.0   Glucose, UA NEGATIVE NEGATIVE mg/dL    Hgb urine dipstick NEGATIVE NEGATIVE   Bilirubin Urine MODERATE (A) NEGATIVE   Ketones, ur NEGATIVE NEGATIVE mg/dL   Protein, ur NEGATIVE NEGATIVE mg/dL   Nitrite POSITIVE (A) NEGATIVE   Leukocytes, UA SMALL (A) NEGATIVE  Urine microscopic-add on     Status: Abnormal   Collection Time: 07/25/16  1:01 PM  Result Value Ref Range   Squamous Epithelial / LPF TOO NUMEROUS TO COUNT (A) NONE SEEN   WBC, UA 0-5 0 - 5 WBC/hpf   RBC / HPF 0-5 0 - 5 RBC/hpf   Bacteria, UA FEW (A) NONE SEEN   Urine-Other MUCOUS PRESENT     Comment: TRICHOMONAS PRESENT  Lipase, blood     Status: Abnormal   Collection Time: 07/25/16  1:11 PM  Result Value Ref Range   Lipase 336 (H) 11 - 51 U/L  Comprehensive metabolic panel     Status: Abnormal   Collection Time: 07/25/16  1:11 PM  Result Value Ref Range   Sodium 123 (L) 135 - 145 mmol/L   Potassium 3.9 3.5 - 5.1 mmol/L   Chloride 94 (L) 101 - 111 mmol/L   CO2 20 (L) 22 - 32 mmol/L   Glucose, Bld 97 65 - 99 mg/dL   BUN 5 (L) 6 - 20 mg/dL   Creatinine, Ser 0.62 0.44 - 1.00 mg/dL   Calcium 9.0 8.9 - 10.3 mg/dL   Total Protein 7.3 6.5 - 8.1 g/dL   Albumin 3.7 3.5 - 5.0 g/dL   AST 35 15 - 41 U/L   ALT 27 14 - 54 U/L   Alkaline Phosphatase 69 38 - 126 U/L   Total Bilirubin 0.6 0.3 - 1.2 mg/dL   GFR calc non Af Amer >60 >60 mL/min   GFR calc Af Amer >60 >60 mL/min    Comment: (NOTE) The eGFR has been calculated using the CKD EPI equation. This calculation has not been validated in all clinical situations. eGFR's persistently <60 mL/min signify possible Chronic Kidney Disease.    Anion gap 9 5 - 15  CBC     Status: Abnormal   Collection Time: 07/25/16  1:11 PM  Result Value Ref Range   WBC 4.9 4.0 - 10.5 K/uL   RBC 3.58 (L) 3.87 - 5.11 MIL/uL   Hemoglobin 11.2 (L) 12.0 - 15.0 g/dL   HCT 31.6 (L) 36.0 - 46.0 %   MCV 88.3 78.0 - 100.0 fL   MCH 31.3 26.0 - 34.0 pg   MCHC 35.4 30.0 - 36.0 g/dL   RDW 15.4 11.5 - 15.5 %   Platelets 138 (L) 150 - 400  K/uL  Osmolality     Status: Abnormal   Collection Time: 07/25/16  6:25 PM  Result Value Ref Range   Osmolality 261 (L) 275 - 295 mOsm/kg    Comment: Performed at [employer]  [facility]  Osmolality, urine     Status: Abnormal   Collection Time: 07/25/16  6:52 PM  Result Value Ref Range   Osmolality, Ur 91 (L) 300 - 900 mOsm/kg    Comment: Performed at [facility]  CBC     Status: Abnormal   Collection Time: 07/26/16  5:49 AM  Result Value Ref Range   WBC 3.5 (L) 4.0 - 10.5 K/uL   RBC 3.21 (L) 3.87 - 5.11 MIL/uL   Hemoglobin 10.0 (L) 12.0 - 15.0 g/dL   HCT 29.5 (L) 36.0 - 46.0 %   MCV 91.9 78.0 - 100.0 fL   MCH 31.2 26.0 - 34.0 pg   MCHC 33.9 30.0 - 36.0 g/dL   RDW 15.4 11.5 - 15.5 %   Platelets 153 150 - 400 K/uL  Comprehensive metabolic panel     Status: Abnormal   Collection Time: 07/26/16  5:49 AM  Result Value Ref Range   Sodium 133 (L) 135 - 145 mmol/L    Comment: RESULT REPEATED AND VERIFIED DELTA CHECK NOTED    Potassium 3.9 3.5 - 5.1 mmol/L   Chloride 104 101 - 111 mmol/L   CO2 22 22 - 32 mmol/L   Glucose, Bld 92 65 - 99 mg/dL   BUN 5 (L) 6 - 20 mg/dL   Creatinine, Ser 0.61 0.44 - 1.00 mg/dL   Calcium 8.4 (L) 8.9 - 10.3 mg/dL   Total Protein 6.4 (L) 6.5 - 8.1 g/dL   Albumin 3.3 (L) 3.5 - 5.0 g/dL   AST 73 (H) 15 - 41 U/L   ALT 32 14 - 54 U/L   Alkaline Phosphatase 72 38 - 126 U/L   Total Bilirubin 0.4 0.3 - 1.2 mg/dL   GFR calc non Af Amer >60 >60 mL/min   GFR calc Af Amer >60 >60 mL/min    Comment: (NOTE) The eGFR has been calculated using the CKD EPI equation. This calculation has not been validated in all clinical situations. eGFR's persistently <60 mL/min signify possible Chronic Kidney Disease.    Anion gap 7 5 - 15  Lipase, blood     Status: Abnormal   Collection Time: 07/26/16  5:49 AM  Result Value Ref Range   Lipase 157 (H) 11 - 51 U/L   US Abdomen Limited  Result Date: 07/26/2016 CLINICAL DATA:  Elevated liver enzymes EXAM: US ABDOMEN  LIMITED - RIGHT UPPER QUADRANT COMPARISON:  None. FINDINGS: Gallbladder: Surgically absent. Common bile duct: Common bile duct is dilated at 15 mm. There is echogenic material in the distal common bile duct concerning for calculi. Liver: No focal lesion identified. Liver echogenicity is diffusely increased. IMPRESSION: Gallbladder absent. Dilated common bile duct with evidence of choledocholithiasis. Increased liver echogenicity is most likely due to hepatic steatosis. While no focal liver lesions are evident, it must be cautioned that the sensitivity of ultrasound for focal liver lesions is diminished in this circumstance. These results will be called to the ordering clinician or representative by the Radiologist Assistant, and communication documented in the PACS or zVision Dashboard. Electronically Signed   By: Lowella Grip III M.D.   On: 07/26/2016 10:47    Assessment: Pancreatitis likely biliary although patient does drink 80 ounces of beer every day. Likely has common bile duct stones probably noted at time of initial cholecystectomy 1 year ago. Plan:  Will likely need ERCP, however with a heavy drinking history and near normal LFTs, will update imaging with an MRCP and then decide for  certain. Otherwise supportive care in the meantime. Itzel Mckibbin C 07/26/2016, 1:02 PM  Pager [phone] If no answer or after 5 PM call [phone]

## 2016-07-27 ENCOUNTER — Inpatient Hospital Stay (HOSPITAL_COMMUNITY): Payer: Medicaid Other

## 2016-07-27 DIAGNOSIS — K852 Alcohol induced acute pancreatitis without necrosis or infection: Principal | ICD-10-CM

## 2016-07-27 LAB — CBC WITH DIFFERENTIAL/PLATELET
BASOS PCT: 0 %
Basophils Absolute: 0 10*3/uL (ref 0.0–0.1)
EOS ABS: 0.1 10*3/uL (ref 0.0–0.7)
EOS PCT: 2 %
HCT: 29.3 % — ABNORMAL LOW (ref 36.0–46.0)
Hemoglobin: 9.7 g/dL — ABNORMAL LOW (ref 12.0–15.0)
LYMPHS ABS: 1.3 10*3/uL (ref 0.7–4.0)
Lymphocytes Relative: 30 %
MCH: 31.2 pg (ref 26.0–34.0)
MCHC: 33.1 g/dL (ref 30.0–36.0)
MCV: 94.2 fL (ref 78.0–100.0)
MONO ABS: 0.5 10*3/uL (ref 0.1–1.0)
MONOS PCT: 12 %
NEUTROS PCT: 56 %
Neutro Abs: 2.4 10*3/uL (ref 1.7–7.7)
PLATELETS: 166 10*3/uL (ref 150–400)
RBC: 3.11 MIL/uL — ABNORMAL LOW (ref 3.87–5.11)
RDW: 15.8 % — AB (ref 11.5–15.5)
WBC: 4.4 10*3/uL (ref 4.0–10.5)

## 2016-07-27 LAB — COMPREHENSIVE METABOLIC PANEL
ALBUMIN: 3.1 g/dL — AB (ref 3.5–5.0)
ALT: 28 U/L (ref 14–54)
AST: 43 U/L — AB (ref 15–41)
Alkaline Phosphatase: 65 U/L (ref 38–126)
Anion gap: 6 (ref 5–15)
CHLORIDE: 110 mmol/L (ref 101–111)
CO2: 22 mmol/L (ref 22–32)
CREATININE: 0.6 mg/dL (ref 0.44–1.00)
Calcium: 8.6 mg/dL — ABNORMAL LOW (ref 8.9–10.3)
GFR calc Af Amer: >60 mL/min (ref >60)
GLUCOSE: 96 mg/dL (ref 65–99)
Potassium: 3.6 mmol/L (ref 3.5–5.1)
Sodium: 138 mmol/L (ref 135–145)
Total Bilirubin: 0.5 mg/dL (ref 0.3–1.2)
Total Protein: 6.1 g/dL — ABNORMAL LOW (ref 6.5–8.1)

## 2016-07-27 LAB — URINE CULTURE

## 2016-07-27 LAB — LIPASE, BLOOD: Lipase: 109 U/L — ABNORMAL HIGH (ref 11–51)

## 2016-07-27 LAB — LIPID PANEL
CHOL/HDL RATIO: 1.6 ratio
Cholesterol: 116 mg/dL (ref 0–200)
HDL: 71 mg/dL (ref >40)
LDL CALC: 35 mg/dL (ref 0–99)
TRIGLYCERIDES: 52 mg/dL (ref <150)
VLDL: 10 mg/dL (ref 0–40)

## 2016-07-27 LAB — VITAMIN B12: VITAMIN B 12: 1076 pg/mL — AB (ref 180–914)

## 2016-07-27 LAB — MAGNESIUM: MAGNESIUM: 2 mg/dL (ref 1.7–2.4)

## 2016-07-27 LAB — TSH: TSH: 2.1 u[IU]/mL (ref 0.350–4.500)

## 2016-07-27 NOTE — Progress Notes (Signed)
Subjective: Abdominal pain improving (but not resolved)  Objective: Vital signs in last 24 hours: Temp:  [97.7 F (36.5 C)-98.3 F (36.8 C)] 97.9 F (36.6 C) (10/09 0530) Pulse Rate:  [84-94] 94 (10/09 0530) Resp:  [18] 18 (10/09 0530) BP: (113-133)/(68-71) 113/71 (10/09 0530) SpO2:  [99 %-100 %] 100 % (10/08 2230) Weight change:  Last BM Date: 07/24/16  PE: GEN:  Older-appearing than stated age ABD:  Mild epigastric tenderness, no peritonitis  Lab Results: CBC    Component Value Date/Time   WBC 4.4 07/27/2016 0409   RBC 3.11 (L) 07/27/2016 0409   HGB 9.7 (L) 07/27/2016 0409   HCT 29.3 (L) 07/27/2016 0409   PLT 166 07/27/2016 0409   MCV 94.2 07/27/2016 0409   MCH 31.2 07/27/2016 0409   MCHC 33.1 07/27/2016 0409   RDW 15.8 (H) 07/27/2016 0409   LYMPHSABS 1.3 07/27/2016 0409   MONOABS 0.5 07/27/2016 0409   EOSABS 0.1 07/27/2016 0409   BASOSABS 0.0 07/27/2016 0409   CMP     Component Value Date/Time   NA 138 07/27/2016 0409   K 3.6 07/27/2016 0409   CL 110 07/27/2016 0409   CO2 22 07/27/2016 0409   GLUCOSE 96 07/27/2016 0409   BUN <5 (L) 07/27/2016 0409   CREATININE 0.60 07/27/2016 0409   CALCIUM 8.6 (L) 07/27/2016 0409   PROT 6.1 (L) 07/27/2016 0409   ALBUMIN 3.1 (L) 07/27/2016 0409   AST 43 (H) 07/27/2016 0409   ALT 28 07/27/2016 0409   ALKPHOS 65 07/27/2016 0409   BILITOT 0.5 07/27/2016 0409   GFRNONAA >60 07/27/2016 0409   GFRAA >60 07/27/2016 0409   Studies/Results: 1.  Ultrasound:  CBD 15mm, echogenic material in CBD (personally reviewed) 2.  MRCP:  CBD 9mm; no obvious choledocholithiasis (personally reviewed)  Assessment:  1.  Pancreatitis, improving.  Alcohol versus gallstone-mediated? 2.  LFTs, modestly elevated, improving, initial pattern more consistent with alcohol. 3.  Abdominal pain, likely from #1 above, improving.  Plan:  1.  Alcohol cessation discussed with patient at length. 2.  Clear liquid diet, advance slowly to low fat as  tolerated. 3.  Based on negative MRCP, improving symptoms, improving LFTs, would not pursue ERCP at this time, though I counseled patient that if she has future attacks of pancreatitis might need to consider possible ERCP (with EUS immediately pre-ERCP). 4.  [facility] will follow.   Freddy JakschOUTLAW,Antinette Keough M 07/27/2016, 2:25 PM   Pager [phone] If no answer or after 5 PM call [phone]

## 2016-07-27 NOTE — Progress Notes (Signed)
PROGRESS NOTE  Ania Levay ZOX:[id number] DOB: 1961-09-30 DOA: 07/25/2016 PCP: No PCP Per Patient  HPI/Recap of past 24 hours:  Report less pain, she returned from MRCP, she wants to eat   Assessment/Plan: Active Problems:   Pancreatitis   Acute pancreatitis   Hyponatremia  1. Acute pancreatitis- ab US showed retained CBG Stone, but MRCP did not showed retained stone. she also drink alcohol daily, lipase improving, less pain, diet per [facility], appreciate input. 2. CBD stone?: s/p cholecystectomy a year ago, she was scheduled to have retained stone removed on 10/4, however, per patient she could not make to the appointment. Mild elevated lft, no fever, no leukocytosis, ab US showed retained CBG Stone, but MRCP did not showed retained stone. GI following. 3. Elevated lft, ast>alt, likely cbd stone and  alcohol related, hepatitis panel in process. 4. Hyponatremia- likely from beer drinking, dehydration, . Normalized after hydration. 5. ? UTI- patient's UA is abnormal shows positive nitrite, she denies any symptoms of dysuria. Urine culture with multiple species, no fever, no leukocytosis, will stop rocephin that was started on admission. Monitor off abx. 6. Alcohol abuse-patient drinks 40 ounces beer everyday,  on CIWA protocol. No symptoms of alcohol withdrawal at this time  7. Itching, request benadryl,    DVT Prophylaxis-   Lovenox , will need to hold lovenox if GI procedure planned  Family Communication: No family at bedside  Code Status:  Full code  Disposition Plan: pending   Consultants:  GI  Procedures:  none  Antibiotics:  Rocephin from admission to 10/9   Objective: BP 113/71 (BP Location: Left Arm)   Pulse 94   Temp 97.9 F (36.6 C) (Oral)   Resp 18   Ht 4\' 11"  (1.499 m)   Wt 72.6 kg (160 lb)   SpO2 100%   BMI 32.32 kg/m   Intake/Output Summary (Last 24 hours) at 07/27/16 0920 Last data filed at 07/27/16 0600  Gross per 24 hour  Intake              2300 ml  Output              175 ml  Net             2125 ml   Filed Weights   07/25/16 1303  Weight: 72.6 kg (160 lb)    Exam:   General:  NAD, no active n/v  Cardiovascular: RRR  Respiratory: CTABL  Abdomen: tender seems has resolved, no guarding, no rebound, NT, positive BS  Musculoskeletal: No Edema  Neuro: aaox3  Data Reviewed: Basic Metabolic Panel:  Recent Labs Lab 07/25/16 1311 07/26/16 0549 07/27/16 0409  NA 123* 133* 138  K 3.9 3.9 3.6  CL 94* 104 110  CO2 20* 22 22  GLUCOSE 97 92 96  BUN 5* 5* <5*  CREATININE 0.62 0.61 0.60  CALCIUM 9.0 8.4* 8.6*  MG  --   --  2.0   Liver Function Tests:  Recent Labs Lab 07/25/16 1311 07/26/16 0549 07/27/16 0409  AST 35 73* 43*  ALT 27 32 28  ALKPHOS 69 72 65  BILITOT 0.6 0.4 0.5  PROT 7.3 6.4* 6.1*  ALBUMIN 3.7 3.3* 3.1*    Recent Labs Lab 07/25/16 1311 07/26/16 0549 07/27/16 0409  LIPASE 336* 157* 109*   No results for input(s): AMMONIA in the last 168 hours. CBC:  Recent Labs Lab 07/25/16 1311 07/26/16 0549 07/27/16 0409  WBC 4.9 3.5* 4.4  NEUTROABS  --   --  2.4  HGB 11.2* 10.0* 9.7*  HCT 31.6* 29.5* 29.3*  MCV 88.3 91.9 94.2  PLT 138* 153 166   Cardiac Enzymes:   No results for input(s): CKTOTAL, CKMB, CKMBINDEX, TROPONINI in the last 168 hours. BNP (last 3 results) No results for input(s): BNP in the last 8760 hours.  ProBNP (last 3 results) No results for input(s): PROBNP in the last 8760 hours.  CBG: No results for input(s): GLUCAP in the last 168 hours.  Recent Results (from the past 240 hour(s))  Culture, Urine     Status: Abnormal   Collection Time: 07/25/16  1:00 PM  Result Value Ref Range Status   Specimen Description URINE, CLEAN CATCH  Final   Special Requests NONE  Final   Culture MULTIPLE SPECIES PRESENT, SUGGEST RECOLLECTION (A)  Final   Report Status 07/27/2016 FINAL  Final     Studies: Mr Abdomen Mrcp Wo Cm  Result Date: 07/27/2016 CLINICAL  DATA:  55 year old female with acute epigastric pain yesterday, with history of chronic a milder epigastric pain. Elevated lipase. Elevated liver function tests. Evaluate for potential common bile duct stone. Prior history of cholecystectomy. EXAM: MRI ABDOMEN WITHOUT CONTRAST  (INCLUDING MRCP) TECHNIQUE: Multiplanar multisequence MR imaging of the abdomen was performed. Heavily T2-weighted images of the biliary and pancreatic ducts were obtained, and three-dimensional MRCP images were rendered by post processing. COMPARISON:  No prior abdominal MRI. Abdominal ultrasound 07/26/2016. FINDINGS: Comment: Today's study is limited for detection and characterization of visceral and/or vascular lesions by lack of IV gadolinium. Lower chest: Unremarkable. Hepatobiliary: Diffuse loss of signal intensity throughout the hepatic parenchyma on out of phase dual echo images, compatible with hepatic steatosis. In segment 3 of the liver there is a 1.3 cm T1 hypo intense, T2 hyperintense lesion which is incompletely characterize, but likely a cyst. No other hepatic lesions are noted. MRCP images are limited by considerable patient motion. With these limitations in mind, there is no significant intrahepatic biliary ductal dilatation. Status post cholecystectomy. No definite filling defects are noted within the common bile duct to suggest choledocholithiasis. Common bile duct is slightly dilated measuring up to 9 mm in the porta hepatis, likely reflective of benign post cholecystectomy physiology. Pancreas: No definite pancreatic mass on today's noncontrast examination. No pancreatic ductal dilatation on MRCP images. Spleen:  Unremarkable. Adrenals/Urinary Tract: Bilateral adrenal glands and bilateral kidneys are normal in appearance on today's noncontrast examination. Stomach/Bowel: Visualized portions are unremarkable. Vascular/Lymphatic: No aneurysm identified in the visualized abdominal vasculature. No lymphadenopathy noted in the  abdomen. Other: No significant volume of ascites noted in the visualized portions of the peritoneal cavity. Musculoskeletal: No aggressive osseous lesions are noted in the visualized portions of the skeleton. IMPRESSION: 1. No definite choledocholithiasis noted. Mild common bile duct dilatation without intrahepatic biliary ductal dilatation, likely reflective of benign post cholecystectomy physiology. 2. No pancreatic ductal dilatation. 3. Hepatic steatosis. 4. Additional incidental findings, as above. Electronically Signed   By: Trudie Reedaniel  Entrikin M.D.   On: 07/27/2016 09:08   Mr 3d Recon At Scanner  Result Date: 07/27/2016 CLINICAL DATA:  55 year old female with acute epigastric pain yesterday, with history of chronic a milder epigastric pain. Elevated lipase. Elevated liver function tests. Evaluate for potential common bile duct stone. Prior history of cholecystectomy. EXAM: MRI ABDOMEN WITHOUT CONTRAST  (INCLUDING MRCP) TECHNIQUE: Multiplanar multisequence MR imaging of the abdomen was performed. Heavily T2-weighted images of the biliary and pancreatic ducts were obtained, and three-dimensional MRCP images were rendered by post processing. COMPARISON:  No prior abdominal MRI. Abdominal ultrasound 07/26/2016. FINDINGS: Comment: Today's study is limited for detection and characterization of visceral and/or vascular lesions by lack of IV gadolinium. Lower chest: Unremarkable. Hepatobiliary: Diffuse loss of signal intensity throughout the hepatic parenchyma on out of phase dual echo images, compatible with hepatic steatosis. In segment 3 of the liver there is a 1.3 cm T1 hypo intense, T2 hyperintense lesion which is incompletely characterize, but likely a cyst. No other hepatic lesions are noted. MRCP images are limited by considerable patient motion. With these limitations in mind, there is no significant intrahepatic biliary ductal dilatation. Status post cholecystectomy. No definite filling defects are noted  within the common bile duct to suggest choledocholithiasis. Common bile duct is slightly dilated measuring up to 9 mm in the porta hepatis, likely reflective of benign post cholecystectomy physiology. Pancreas: No definite pancreatic mass on today's noncontrast examination. No pancreatic ductal dilatation on MRCP images. Spleen:  Unremarkable. Adrenals/Urinary Tract: Bilateral adrenal glands and bilateral kidneys are normal in appearance on today's noncontrast examination. Stomach/Bowel: Visualized portions are unremarkable. Vascular/Lymphatic: No aneurysm identified in the visualized abdominal vasculature. No lymphadenopathy noted in the abdomen. Other: No significant volume of ascites noted in the visualized portions of the peritoneal cavity. Musculoskeletal: No aggressive osseous lesions are noted in the visualized portions of the skeleton. IMPRESSION: 1. No definite choledocholithiasis noted. Mild common bile duct dilatation without intrahepatic biliary ductal dilatation, likely reflective of benign post cholecystectomy physiology. 2. No pancreatic ductal dilatation. 3. Hepatic steatosis. 4. Additional incidental findings, as above. Electronically Signed   By: Trudie Reed M.D.   On: 07/27/2016 09:08   US Abdomen Limited  Result Date: 07/26/2016 CLINICAL DATA:  Elevated liver enzymes EXAM: US ABDOMEN LIMITED - RIGHT UPPER QUADRANT COMPARISON:  None. FINDINGS: Gallbladder: Surgically absent. Common bile duct: Common bile duct is dilated at 15 mm. There is echogenic material in the distal common bile duct concerning for calculi. Liver: No focal lesion identified. Liver echogenicity is diffusely increased. IMPRESSION: Gallbladder absent. Dilated common bile duct with evidence of choledocholithiasis. Increased liver echogenicity is most likely due to hepatic steatosis. While no focal liver lesions are evident, it must be cautioned that the sensitivity of ultrasound for focal liver lesions is diminished in  this circumstance. These results will be called to the ordering clinician or representative by the Radiologist Assistant, and communication documented in the PACS or zVision Dashboard. Electronically Signed   By: Bretta Bang III M.D.   On: 07/26/2016 10:47    Scheduled Meds: . cefTRIAXone (ROCEPHIN)  IV  1 g Intravenous Q24H  . enoxaparin (LOVENOX) injection  40 mg Subcutaneous Q24H  . folic acid  1 mg Oral Daily  . Influenza vac split quadrivalent PF  0.5 mL Intramuscular Tomorrow-1000  . mouth rinse  15 mL Mouth Rinse BID  . multivitamin with minerals  1 tablet Oral Daily  . pneumococcal 23 valent vaccine  0.5 mL Intramuscular Tomorrow-1000  . thiamine  100 mg Oral Daily   Or  . thiamine  100 mg Intravenous Daily    Continuous Infusions: . sodium chloride 100 mL/hr at 07/27/16 0600     Time spent:  Raye Slyter MD, PhD  [facility] Pager [phone]. If 7PM-7AM, please contact night-coverage at [email or web address], password [facility] 07/27/2016, 9:20 AM  LOS: 1 day

## 2016-07-28 LAB — COMPREHENSIVE METABOLIC PANEL
ALBUMIN: 2.8 g/dL — AB (ref 3.5–5.0)
ALT: 26 U/L (ref 14–54)
AST: 41 U/L (ref 15–41)
Alkaline Phosphatase: 58 U/L (ref 38–126)
Anion gap: 5 (ref 5–15)
CHLORIDE: 110 mmol/L (ref 101–111)
CO2: 22 mmol/L (ref 22–32)
CREATININE: 0.48 mg/dL (ref 0.44–1.00)
Calcium: 8.6 mg/dL — ABNORMAL LOW (ref 8.9–10.3)
GFR calc Af Amer: >60 mL/min (ref >60)
GLUCOSE: 89 mg/dL (ref 65–99)
POTASSIUM: 3.3 mmol/L — AB (ref 3.5–5.1)
SODIUM: 137 mmol/L (ref 135–145)
Total Bilirubin: 0.3 mg/dL (ref 0.3–1.2)
Total Protein: 5.8 g/dL — ABNORMAL LOW (ref 6.5–8.1)

## 2016-07-28 LAB — FOLATE RBC
FOLATE, RBC: 1292 ng/mL (ref >498)
Folate, Hemolysate: 382.5 ng/mL
Hematocrit: 29.6 % — ABNORMAL LOW (ref 34.0–46.6)

## 2016-07-28 LAB — HEPATITIS PANEL, ACUTE
HEP A IGM: NEGATIVE
HEP B C IGM: NEGATIVE
Hepatitis B Surface Ag: NEGATIVE

## 2016-07-28 LAB — LIPASE, BLOOD: Lipase: 66 U/L — ABNORMAL HIGH (ref 11–51)

## 2016-07-28 LAB — CBC
HCT: 26.5 % — ABNORMAL LOW (ref 36.0–46.0)
Hemoglobin: 9 g/dL — ABNORMAL LOW (ref 12.0–15.0)
MCH: 31.8 pg (ref 26.0–34.0)
MCHC: 34 g/dL (ref 30.0–36.0)
MCV: 93.6 fL (ref 78.0–100.0)
PLATELETS: 179 10*3/uL (ref 150–400)
RBC: 2.83 MIL/uL — AB (ref 3.87–5.11)
RDW: 16.1 % — ABNORMAL HIGH (ref 11.5–15.5)
WBC: 4 10*3/uL (ref 4.0–10.5)

## 2016-07-28 MED ORDER — OXYCODONE-ACETAMINOPHEN 5-325 MG PO TABS: 1.0000 | ORAL_TABLET | Freq: Three times a day (TID) | ORAL | 0 refills | Status: AC | PRN

## 2016-07-28 NOTE — Progress Notes (Signed)
Subjective: Pain almost gone. Tolerating diet.  Objective: Vital signs in last 24 hours: Temp:  [97.9 F (36.6 C)-98.6 F (37 C)] 98.1 F (36.7 C) (10/10 0541) Pulse Rate:  [70-79] 70 (10/10 0541) Resp:  [16-18] 16 (10/10 0541) BP: (133-155)/(59-81) 133/59 (10/10 0541) SpO2:  [99 %-100 %] 99 % (10/10 0541) Weight change:  Last BM Date: 07/24/16  PE: GEN:  NAD, older-appearing than stated age ABD:  Protuberant, soft, minimally tender, no peritonitis  Lab Results: CBC    Component Value Date/Time   WBC 4.0 07/28/2016 0447   RBC 2.83 (L) 07/28/2016 0447   HGB 9.0 (L) 07/28/2016 0447   HCT 26.5 (L) 07/28/2016 0447   PLT 179 07/28/2016 0447   MCV 93.6 07/28/2016 0447   MCH 31.8 07/28/2016 0447   MCHC 34.0 07/28/2016 0447   RDW 16.1 (H) 07/28/2016 0447   LYMPHSABS 1.3 07/27/2016 0409   MONOABS 0.5 07/27/2016 0409   EOSABS 0.1 07/27/2016 0409   BASOSABS 0.0 07/27/2016 0409   CMP     Component Value Date/Time   NA 137 07/28/2016 0447   K 3.3 (L) 07/28/2016 0447   CL 110 07/28/2016 0447   CO2 22 07/28/2016 0447   GLUCOSE 89 07/28/2016 0447   BUN <5 (L) 07/28/2016 0447   CREATININE 0.48 07/28/2016 0447   CALCIUM 8.6 (L) 07/28/2016 0447   PROT 5.8 (L) 07/28/2016 0447   ALBUMIN 2.8 (L) 07/28/2016 0447   AST 41 07/28/2016 0447   ALT 26 07/28/2016 0447   ALKPHOS 58 07/28/2016 0447   BILITOT 0.3 07/28/2016 0447   GFRNONAA >60 07/28/2016 0447   GFRAA >60 07/28/2016 0447   Assessment:  1.  Pancreatitis, improving.  Alcohol versus gallstone-mediated? 2.  LFTs, modestly elevated, essentially normal now, initial pattern more consistent with alcohol. 3.  Abdominal pain, likely from #1 above, near-completely resolved.  Plan:  1.  Advance diet slowly, peak soft low fat for the next couple weeks. 2.  Alcohol cessation. 3.  If tolerates advance in diet today, it's ok for patient to be discharged home today from GI perspective. 4.  Patient can follow-up with [facility] on  as-needed basis. 5.  [facility] will sign-off; please call with questions; thank you for the consultation.   Freddy JakschOUTLAW,Lisa Allen 07/28/2016, 10:57 AM   Pager [phone] If no answer or after 5 PM call [phone]

## 2016-07-28 NOTE — Discharge Summary (Signed)
Discharge Summary  Lisa Allen ZOX:[id number] DOB: 1961/05/16  PCP: No PCP Per Patient  Admit date: 07/25/2016 Discharge date: 07/28/2016  Time spent: <82mins  Recommendations for Outpatient Follow-up:  1. F/u with PMD within a week  for hospital discharge follow up, repeat cbc/bmp at follow up 2. F/u with [facility] for pancreatitis Patient requested pain meds, percocet 5/325 total of 5 tabs provided at discharge Continue all home meds, no other new meds at discharge.  Discharge Diagnoses:  Active Hospital Problems   Diagnosis Date Noted  . Pancreatitis 07/25/2016  . Acute pancreatitis 07/25/2016  . Hyponatremia 07/25/2016    Resolved Hospital Problems   Diagnosis Date Noted Date Resolved  No resolved problems to display.    Discharge Condition: stable  Diet recommendation: heart healthy, low fat  Filed Weights   07/25/16 1303  Weight: 72.6 kg (160 lb)    History of present illness:  Lisa Allen  is a 55 y.o. female, With history of gallstones status post cholecystectomy who came to the hospital with 1 day history of upper abdominal pain. Pain has been constant associated with nausea and several episodes of bilious vomiting. She denies diarrhea. No chest pain or shortness of breath. She does drink 40 ounces of beer everyday. Patient took Aleve at home for pain which did not improve it. She denies fever or chills. No dysuria urgency or frequency of urination.  In the ED lab work showed lipase 336, sodium 123.  Hospital Course:  Active Problems:   Pancreatitis   Acute pancreatitis   Hyponatremia   1. Acute pancreatitis- ab US showed retained CBG Stone, but MRCP did not showed retained stone. she also drink alcohol daily, lipase improving, pain has resolved, tolerated diet advancement, she is cleared to discharge home by GI. 2. CBD stone?: s/p cholecystectomy a year ago, she was scheduled to have retained stone removed on 10/4, however, per patient she could not make  to the appointment. Mild elevated lft which has normalized, no fever, no leukocytosis, ab US showed retained CBG Stone, but MRCP did not showed retained stone. GI input appreciated, outpatient gi follow up. 3. Elevated lft, ast>alt, likely cbd stone and  alcohol related, hepatitis panel negative. lft normalized at discharge. 4. Hyponatremia- likely from beer drinking, dehydration, . Normalized after hydration. 5. ? UTI-patient's UA is abnormal shows positive nitrite, she denies any symptoms of dysuria. Urine culture with multiple species, no fever, no leukocytosis, will stop rocephin that was started on admission. Monitor off abx. 6. Alcohol abuse-patient drinks 40 ounces beer everyday,  on CIWA protocol. No symptoms of alcohol withdrawal at this time  7. Itching, request benadryl, resolved.    Family Communication:No family at bedside  Code Status:Full code  Disposition Plan: home on 10/10   Consultants:  GI  Procedures:  none  Antibiotics:  Rocephin from admission to 10/9    Discharge Exam: BP 140/77 (BP Location: Right Arm)   Pulse 87   Temp 97.8 F (36.6 C) (Oral)   Resp 18   Ht 4\' 11"  (1.499 m)   Wt 72.6 kg (160 lb)   SpO2 100%   BMI 32.32 kg/m   General: NAD Cardiovascular: RRR Respiratory: CTABL  Discharge Instructions You were cared for by a hospitalist during your hospital stay. If you have any questions about your discharge medications or the care you received while you were in the hospital after you are discharged, you can call the unit and asked to speak with the hospitalist on call  if the hospitalist that took care of you is not available. Once you are discharged, your primary care physician will handle any further medical issues. Please note that NO REFILLS for any discharge medications will be authorized once you are discharged, as it is imperative that you return to your primary care physician (or establish a relationship with a primary care  physician if you do not have one) for your aftercare needs so that they can reassess your need for medications and monitor your lab values.  Discharge Instructions    Diet general    Complete by:  As directed    Heart healthy, low fat   Increase activity slowly    Complete by:  As directed        Medication List    TAKE these medications   amLODipine 5 MG tablet Commonly known as:  NORVASC Take 5 mg by mouth every morning.   atorvastatin 20 MG tablet Commonly known as:  LIPITOR Take 20 mg by mouth every evening.   busPIRone 5 MG tablet Commonly known as:  BUSPAR Take 5 mg by mouth 3 (three) times daily as needed (anxiety).   cholecalciferol 1000 units tablet Commonly known as:  VITAMIN D Take 1,000 Units by mouth every morning.   folic acid 1 MG tablet Commonly known as:  FOLVITE Take 1 mg by mouth every morning.   lisinopril 20 MG tablet Commonly known as:  PRINIVIL,ZESTRIL Take 20 mg by mouth daily.   methocarbamol 500 MG tablet Commonly known as:  ROBAXIN Take 500 mg by mouth 3 (three) times daily as needed for muscle spasms.   ondansetron 4 MG tablet Commonly known as:  ZOFRAN Take 4 mg by mouth every 8 (eight) hours as needed for nausea or vomiting.   oxyCODONE-acetaminophen 5-325 MG tablet Commonly known as:  PERCOCET/ROXICET Take 1 tablet by mouth every 8 (eight) hours as needed for severe pain.   pantoprazole 40 MG tablet Commonly known as:  PROTONIX Take 40 mg by mouth every morning.   potassium chloride SA 20 MEQ tablet Commonly known as:  K-DUR,KLOR-CON Take 20 mEq by mouth every morning.   traZODone 50 MG tablet Commonly known as:  DESYREL Take 50 mg by mouth at bedtime.   triamcinolone cream 0.1 % Commonly known as:  KENALOG Apply 1 application topically 3 (three) times daily as needed (rash).      No Known Allergies Follow-up Information    Freddy Jaksch, MD Follow up in 1 month(s).   Specialty:  Gastroenterology Why:   pancreatitis Contact information: [address] [place] [address] [phone]        [facility] Follow up in 1 week(s).   Why:  hospital discharge follow up, repeat bmp/lipase at follow up.  Contact information: [address] [place] [address] [phone]           The results of significant diagnostics from this hospitalization (including imaging, microbiology, ancillary and laboratory) are listed below for reference.    Significant Diagnostic Studies: Mr Abdomen Mrcp Wo Cm  Result Date: 07/27/2016 CLINICAL DATA:  55 year old female with acute epigastric pain yesterday, with history of chronic a milder epigastric pain. Elevated lipase. Elevated liver function tests. Evaluate for potential common bile duct stone. Prior history of cholecystectomy. EXAM: MRI ABDOMEN WITHOUT CONTRAST  (INCLUDING MRCP) TECHNIQUE: Multiplanar multisequence MR imaging of the abdomen was performed. Heavily T2-weighted images of the biliary and pancreatic ducts were obtained, and  three-dimensional MRCP images were rendered by post processing. COMPARISON:  No prior abdominal MRI. Abdominal ultrasound 07/26/2016. FINDINGS: Comment: Today's study is limited for detection and characterization of visceral and/or vascular lesions by lack of IV gadolinium. Lower chest: Unremarkable. Hepatobiliary: Diffuse loss of signal intensity throughout the hepatic parenchyma on out of phase dual echo images, compatible with hepatic steatosis. In segment 3 of the liver there is a 1.3 cm T1 hypo intense, T2 hyperintense lesion which is incompletely characterize, but likely a cyst. No other hepatic lesions are noted. MRCP images are limited by considerable patient motion. With these limitations in mind, there is no significant intrahepatic biliary ductal dilatation. Status post cholecystectomy. No definite filling defects are noted within the common bile duct  to suggest choledocholithiasis. Common bile duct is slightly dilated measuring up to 9 mm in the porta hepatis, likely reflective of benign post cholecystectomy physiology. Pancreas: No definite pancreatic mass on today's noncontrast examination. No pancreatic ductal dilatation on MRCP images. Spleen:  Unremarkable. Adrenals/Urinary Tract: Bilateral adrenal glands and bilateral kidneys are normal in appearance on today's noncontrast examination. Stomach/Bowel: Visualized portions are unremarkable. Vascular/Lymphatic: No aneurysm identified in the visualized abdominal vasculature. No lymphadenopathy noted in the abdomen. Other: No significant volume of ascites noted in the visualized portions of the peritoneal cavity. Musculoskeletal: No aggressive osseous lesions are noted in the visualized portions of the skeleton. IMPRESSION: 1. No definite choledocholithiasis noted. Mild common bile duct dilatation without intrahepatic biliary ductal dilatation, likely reflective of benign post cholecystectomy physiology. 2. No pancreatic ductal dilatation. 3. Hepatic steatosis. 4. Additional incidental findings, as above. Electronically Signed   By: Trudie Reed M.D.   On: 07/27/2016 09:08   Mr 3d Recon At Scanner  Result Date: 07/27/2016 CLINICAL DATA:  55 year old female with acute epigastric pain yesterday, with history of chronic a milder epigastric pain. Elevated lipase. Elevated liver function tests. Evaluate for potential common bile duct stone. Prior history of cholecystectomy. EXAM: MRI ABDOMEN WITHOUT CONTRAST  (INCLUDING MRCP) TECHNIQUE: Multiplanar multisequence MR imaging of the abdomen was performed. Heavily T2-weighted images of the biliary and pancreatic ducts were obtained, and three-dimensional MRCP images were rendered by post processing. COMPARISON:  No prior abdominal MRI. Abdominal ultrasound 07/26/2016. FINDINGS: Comment: Today's study is limited for detection and characterization of visceral  and/or vascular lesions by lack of IV gadolinium. Lower chest: Unremarkable. Hepatobiliary: Diffuse loss of signal intensity throughout the hepatic parenchyma on out of phase dual echo images, compatible with hepatic steatosis. In segment 3 of the liver there is a 1.3 cm T1 hypo intense, T2 hyperintense lesion which is incompletely characterize, but likely a cyst. No other hepatic lesions are noted. MRCP images are limited by considerable patient motion. With these limitations in mind, there is no significant intrahepatic biliary ductal dilatation. Status post cholecystectomy. No definite filling defects are noted within the common bile duct to suggest choledocholithiasis. Common bile duct is slightly dilated measuring up to 9 mm in the porta hepatis, likely reflective of benign post cholecystectomy physiology. Pancreas: No definite pancreatic mass on today's noncontrast examination. No pancreatic ductal dilatation on MRCP images. Spleen:  Unremarkable. Adrenals/Urinary Tract: Bilateral adrenal glands and bilateral kidneys are normal in appearance on today's noncontrast examination. Stomach/Bowel: Visualized portions are unremarkable. Vascular/Lymphatic: No aneurysm identified in the visualized abdominal vasculature. No lymphadenopathy noted in the abdomen. Other: No significant volume of ascites noted in the visualized portions of the peritoneal cavity. Musculoskeletal: No aggressive osseous lesions are noted in the visualized portions of the  skeleton. IMPRESSION: 1. No definite choledocholithiasis noted. Mild common bile duct dilatation without intrahepatic biliary ductal dilatation, likely reflective of benign post cholecystectomy physiology. 2. No pancreatic ductal dilatation. 3. Hepatic steatosis. 4. Additional incidental findings, as above. Electronically Signed   By: Trudie Reed M.D.   On: 07/27/2016 09:08   US Abdomen Limited  Result Date: 07/26/2016 CLINICAL DATA:  Elevated liver enzymes EXAM: US  ABDOMEN LIMITED - RIGHT UPPER QUADRANT COMPARISON:  None. FINDINGS: Gallbladder: Surgically absent. Common bile duct: Common bile duct is dilated at 15 mm. There is echogenic material in the distal common bile duct concerning for calculi. Liver: No focal lesion identified. Liver echogenicity is diffusely increased. IMPRESSION: Gallbladder absent. Dilated common bile duct with evidence of choledocholithiasis. Increased liver echogenicity is most likely due to hepatic steatosis. While no focal liver lesions are evident, it must be cautioned that the sensitivity of ultrasound for focal liver lesions is diminished in this circumstance. These results will be called to the ordering clinician or representative by the Radiologist Assistant, and communication documented in the PACS or zVision Dashboard. Electronically Signed   By: Bretta Bang III M.D.   On: 07/26/2016 10:47    Microbiology: Recent Results (from the past 240 hour(s))  Culture, Urine     Status: Abnormal   Collection Time: 07/25/16  1:00 PM  Result Value Ref Range Status   Specimen Description URINE, CLEAN CATCH  Final   Special Requests NONE  Final   Culture MULTIPLE SPECIES PRESENT, SUGGEST RECOLLECTION (A)  Final   Report Status 07/27/2016 FINAL  Final     Labs: Basic Metabolic Panel:  Recent Labs Lab 07/25/16 1311 07/26/16 0549 07/27/16 0409 07/28/16 0447  NA 123* 133* 138 137  K 3.9 3.9 3.6 3.3*  CL 94* 104 110 110  CO2 20* GLUCOSE 97 92 96 89  BUN 5* 5* <5* <5*  CREATININE 0.62 0.61 0.60 0.48  CALCIUM 9.0 8.4* 8.6* 8.6*  MG  --   --  2.0  --    Liver Function Tests:  Recent Labs Lab 07/25/16 1311 07/26/16 0549 07/27/16 0409 07/28/16 0447  AST 35 73* 43* 41  ALT 27 32 28 26  ALKPHOS 69 72 65 58  BILITOT 0.6 0.4 0.5 0.3  PROT 7.3 6.4* 6.1* 5.8*  ALBUMIN 3.7 3.3* 3.1* 2.8*    Recent Labs Lab 07/25/16 1311 07/26/16 0549 07/27/16 0409 07/28/16 0447  LIPASE 336* 157* 109* 66*   No results  for input(s): AMMONIA in the last 168 hours. CBC:  Recent Labs Lab 07/25/16 1311 07/26/16 0549 07/27/16 0409 07/28/16 0447  WBC 4.9 3.5* 4.4 4.0  NEUTROABS  --   --  2.4  --   HGB 11.2* 10.0* 9.7* 9.0*  HCT 31.6* 29.5* 29.3* 26.5*  MCV 88.3 91.9 94.2 93.6  PLT 138* 153 166 179   Cardiac Enzymes: No results for input(s): CKTOTAL, CKMB, CKMBINDEX, TROPONINI in the last 168 hours. BNP: BNP (last 3 results) No results for input(s): BNP in the last 8760 hours.  ProBNP (last 3 results) No results for input(s): PROBNP in the last 8760 hours.  CBG: No results for input(s): GLUCAP in the last 168 hours.     SignedAlbertine Grates MD, PhD  [facility] 07/28/2016, 2:14 PM

## 2016-07-28 NOTE — Progress Notes (Signed)
Pt's vitals are WNL, tolerating diet and pain is under control. Discussed discharge instructions and all questions and concerns addressed. Discharged to home with prescriptions.

## 2016-07-28 NOTE — Progress Notes (Signed)
Spoke with patient at bedside. [place] from [place], [place], has active Medicaid. Patient is requesting assistance finding a new PCP, provided patient with written resources for finding a PCP, also provided her with [employer] number. Encouraged her to request new social worker here as well to help as needed. No other d/c needs identified. Daughter will assist with d/c.

## 2018-04-30 IMAGING — MR MR 3D RECON AT SCANNER
12 series · 16 of 16 positions shown · non-contrast
Comparison: No prior abdominal MRI. Abdominal ultrasound
07/26/2016.

CLINICAL DATA: 55-year-old female with acute epigastric pain
yesterday, with history of chronic a milder epigastric pain.
Elevated lipase. Elevated liver function tests. Evaluate for
potential common bile duct stone. Prior history of cholecystectomy.

EXAM:
MRI ABDOMEN WITHOUT CONTRAST  (INCLUDING MRCP)
TECHNIQUE: Multiplanar multisequence MR imaging of the abdomen was performed.
Heavily T2-weighted images of the biliary and pancreatic ducts were
obtained, and three-dimensional MRCP images were rendered by post
processing.

[Series 3: T2 fat-sat · axial · 5.0mm · 0.78mm/px · 1 of 50 slices shown]
[im 1/50]
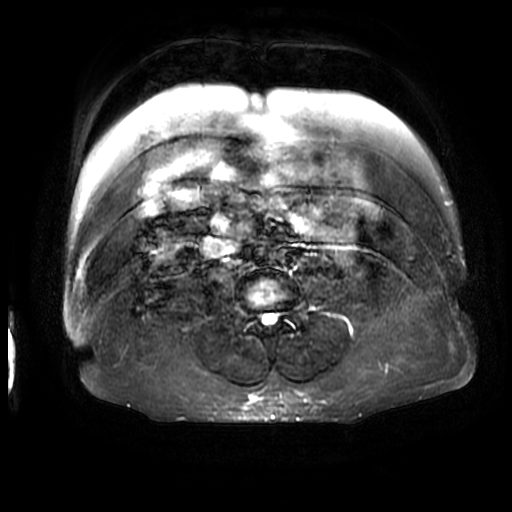

[Series 4: DWI b500 · axial · 6.0mm · 1.48mm/px · 1 of 60 slices shown]
[im 1/60]
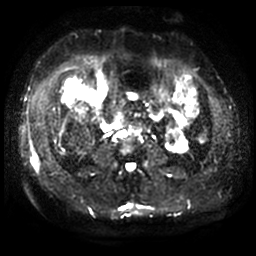

[Series 5: MRCP · coronal · 1.6mm · 0.62mm/px · 1 of 97 slices shown (1 of 3)]
[im 1/97]
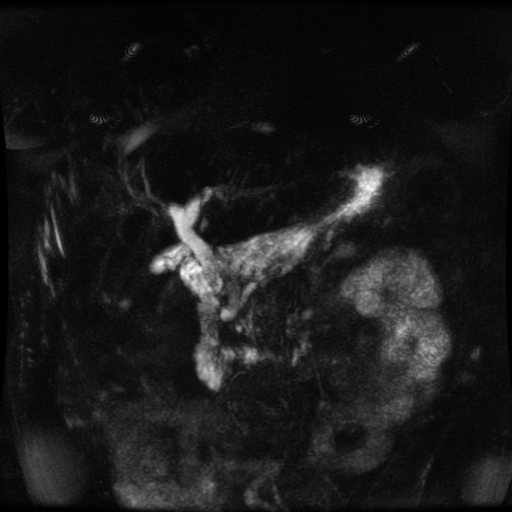

[Series 6: MRCP · coronal · 2.0mm · 0.70mm/px · 1 of 39 slices shown (2 of 3)]
[im 1/39]
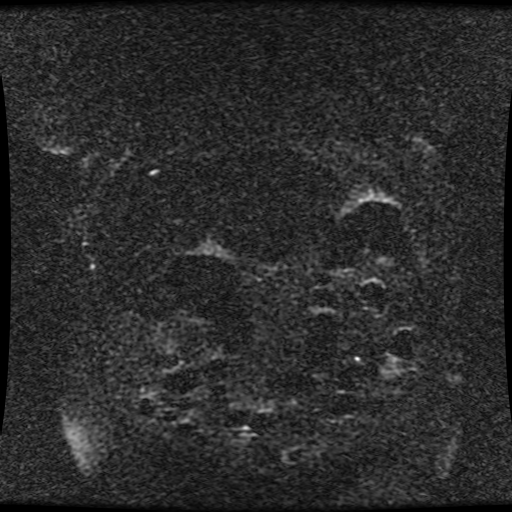

[Series 9: ax dualecho · axial · 5.0mm · 0.78mm/px · 1 of 90 slices shown]
[im 1/90]
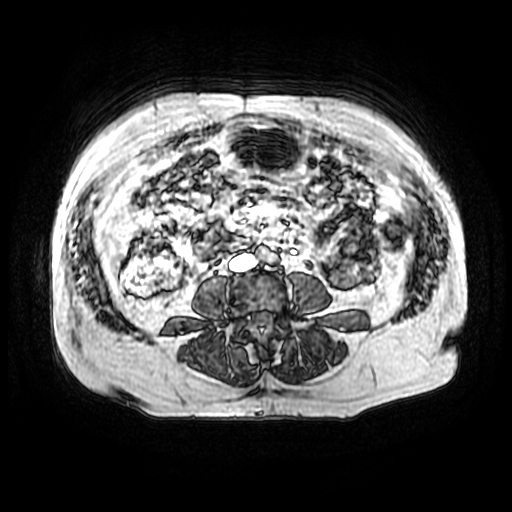

[Series 10: MRCP · coronal · 40.0mm · 0.70mm/px · 1 of 8 slices shown (3 of 3)]
[im 1/8]
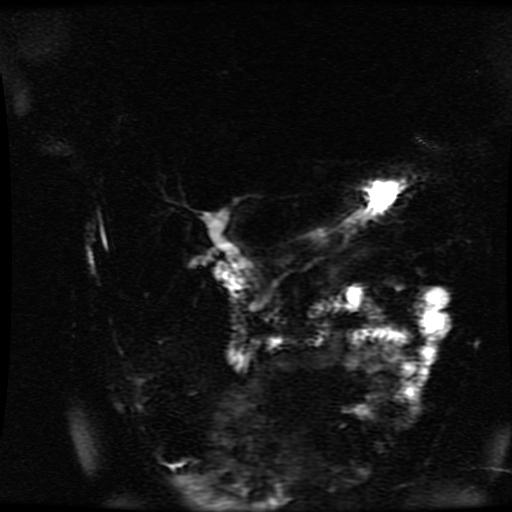

[Series 11: bSSFP fat-sat · coronal · 5.0mm · 0.70mm/px · 1 of 41 slices shown]
[im 1/41]
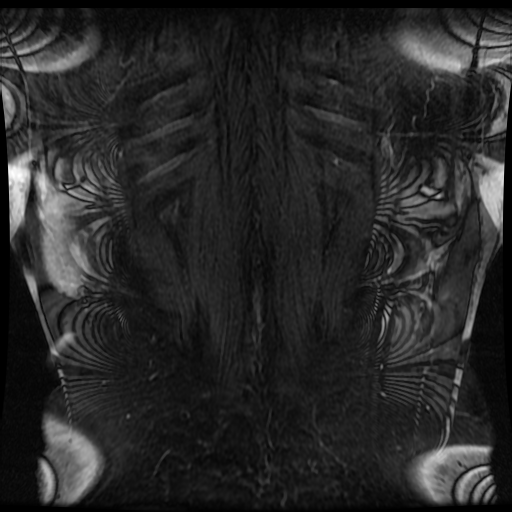

[Series 12: T2 · axial · 5.0mm · 0.78mm/px · 1 of 45 slices shown]
[im 1/45]
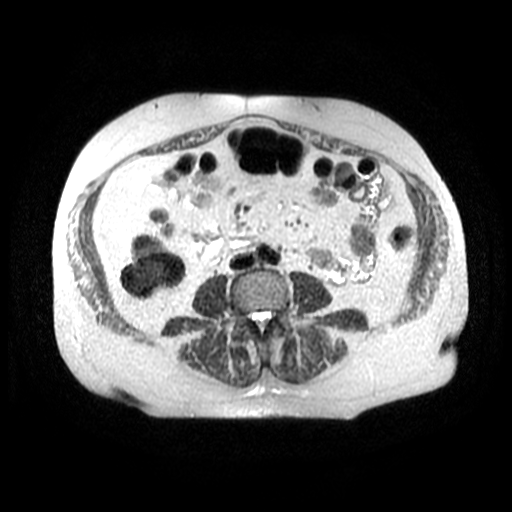

[Series 400: DWI · axial · 6.0mm · 1.48mm/px · 1 of 30 slices shown]
[im 1/30]
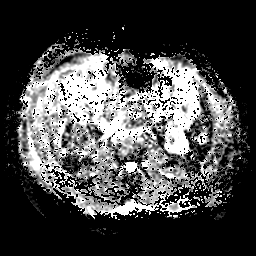

[Series 500: reformatted · axial · 1.6mm · 0.62mm/px · z∈[+37,+167]mm · 2 of 117 slices shown (1 of 2)]
[im 1/117]
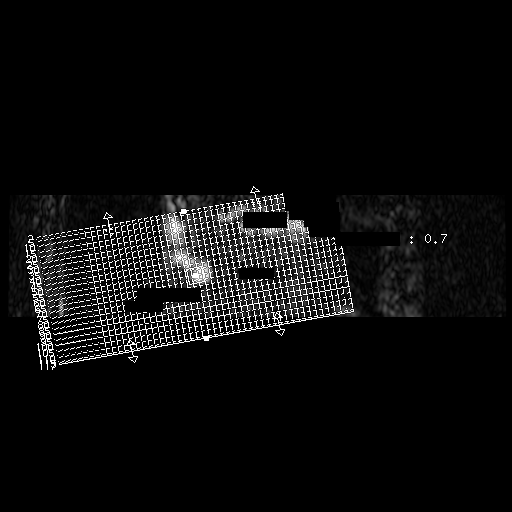
[im 117/117]
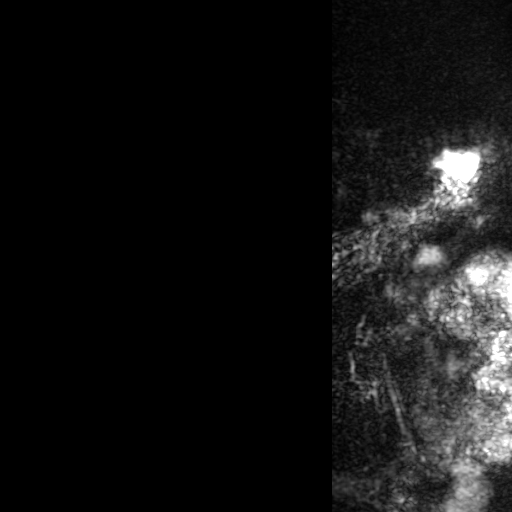

[Series 501: reformatted · axial · 1.6mm · 0.62mm/px · z∈[+37,+168]mm · 3 of 181 slices shown (2 of 2)]
[im 1/181]
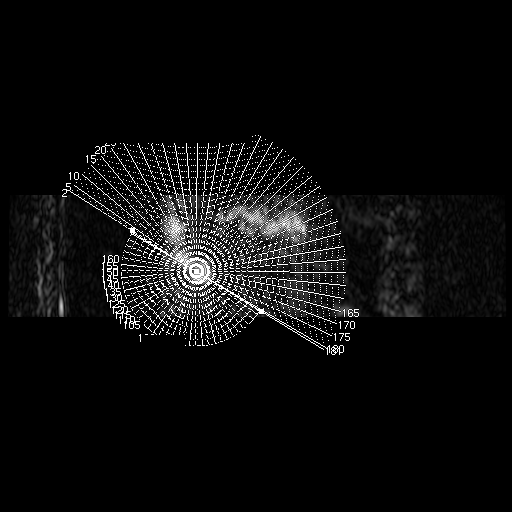
[im 91/181]
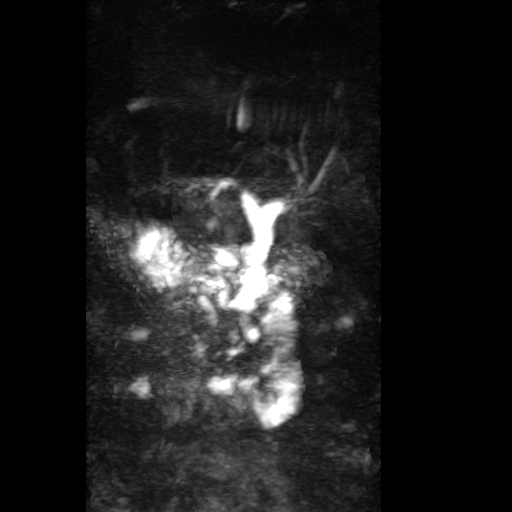
[im 181/181]
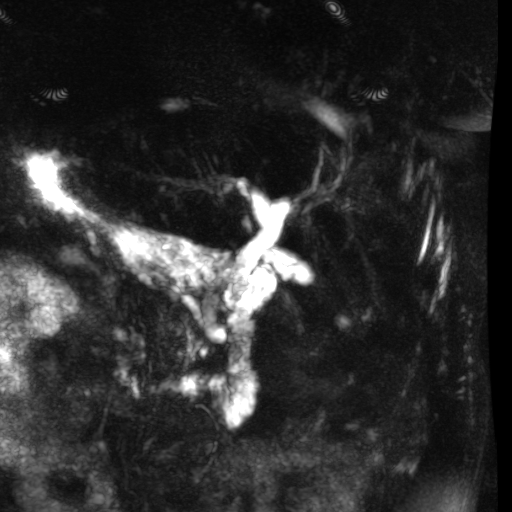

[Series 1300: T1 dynamic · axial · 5.0mm · 0.78mm/px · z∈[-66,+152]mm · 2 of 88 slices shown]
[im 1/88]
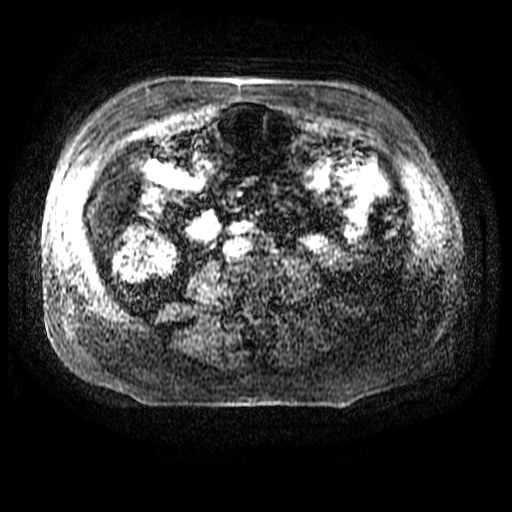
[im 88/88]
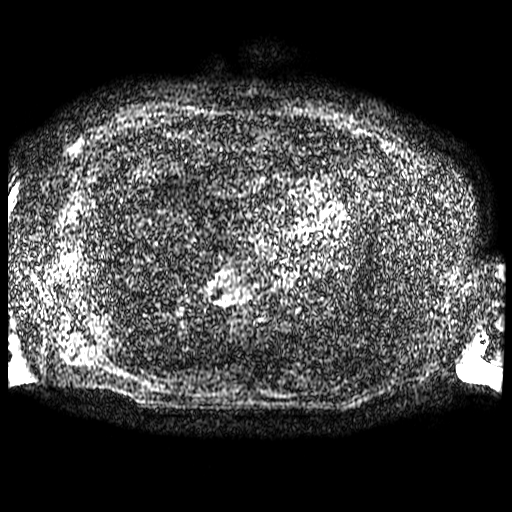

[16 of 16 positions shown; findings below may reference images not displayed]

FINDINGS: Comment: Today's study is limited for detection and characterization
of visceral and/or vascular lesions by lack of IV gadolinium.

Lower chest: Unremarkable.

Hepatobiliary: Diffuse loss of signal intensity throughout the
hepatic parenchyma on out of phase dual echo images, compatible with
hepatic steatosis. In segment 3 of the liver there is a 1.3 cm T1
hypo intense, T2 hyperintense lesion which is incompletely
characterize, but likely a cyst. No other hepatic lesions are noted.
MRCP images are limited by considerable patient motion. With these
limitations in mind, there is no significant intrahepatic biliary
ductal dilatation. Status post cholecystectomy. No definite filling
defects are noted within the common bile duct to suggest
choledocholithiasis. Common bile duct is slightly dilated measuring
up to 9 mm in the porta hepatis, likely reflective of benign post
cholecystectomy physiology.

Pancreas: No definite pancreatic mass on today's noncontrast
examination. No pancreatic ductal dilatation on MRCP images.

Spleen:  Unremarkable.

Adrenals/Urinary Tract: Bilateral adrenal glands and bilateral
kidneys are normal in appearance on today's noncontrast examination.

Stomach/Bowel: Visualized portions are unremarkable.

Vascular/Lymphatic: No aneurysm identified in the visualized
abdominal vasculature. No lymphadenopathy noted in the abdomen.

Other: No significant volume of ascites noted in the visualized
portions of the peritoneal cavity.

Musculoskeletal: No aggressive osseous lesions are noted in the
visualized portions of the skeleton.
IMPRESSION: 1. No definite choledocholithiasis noted. Mild common bile duct
dilatation without intrahepatic biliary ductal dilatation, likely
reflective of benign post cholecystectomy physiology.
2. No pancreatic ductal dilatation.
3. Hepatic steatosis.
4. Additional incidental findings, as above.

## 2021-06-19 DEATH — deceased
